# Patient Record
Sex: Female | Born: 1980 | Race: White | Hispanic: Yes | Smoking: Former smoker
Health system: Southern US, Community
[De-identification: ages and names within clinical notes are randomized; demographics above are authoritative.]

## PROBLEM LIST (undated history)

## (undated) ENCOUNTER — Inpatient Hospital Stay (HOSPITAL_COMMUNITY): Payer: Self-pay

## (undated) DIAGNOSIS — O34219 Maternal care for unspecified type scar from previous cesarean delivery: Secondary | ICD-10-CM

## (undated) DIAGNOSIS — IMO0002 Reserved for concepts with insufficient information to code with codable children: Secondary | ICD-10-CM

## (undated) DIAGNOSIS — Z98891 History of uterine scar from previous surgery: Secondary | ICD-10-CM

## (undated) DIAGNOSIS — R87619 Unspecified abnormal cytological findings in specimens from cervix uteri: Secondary | ICD-10-CM

## (undated) HISTORY — DX: Reserved for concepts with insufficient information to code with codable children: IMO0002

## (undated) HISTORY — DX: Unspecified abnormal cytological findings in specimens from cervix uteri: R87.619

---

## 2004-09-29 ENCOUNTER — Emergency Department (HOSPITAL_COMMUNITY): Admission: EM | Admit: 2004-09-29 | Discharge: 2004-09-29 | Payer: Self-pay | Admitting: *Deleted

## 2005-02-17 ENCOUNTER — Ambulatory Visit: Payer: Self-pay | Admitting: Family Medicine

## 2005-02-23 ENCOUNTER — Encounter: Admission: RE | Admit: 2005-02-23 | Discharge: 2005-02-23 | Payer: Self-pay | Admitting: Family Medicine

## 2005-02-25 ENCOUNTER — Ambulatory Visit: Payer: Self-pay | Admitting: *Deleted

## 2005-03-11 ENCOUNTER — Ambulatory Visit (HOSPITAL_BASED_OUTPATIENT_CLINIC_OR_DEPARTMENT_OTHER): Admission: RE | Admit: 2005-03-11 | Discharge: 2005-03-11 | Payer: Self-pay | Admitting: General Surgery

## 2005-03-11 ENCOUNTER — Ambulatory Visit (HOSPITAL_COMMUNITY): Admission: RE | Admit: 2005-03-11 | Discharge: 2005-03-11 | Payer: Self-pay | Admitting: General Surgery

## 2005-03-11 ENCOUNTER — Encounter (INDEPENDENT_AMBULATORY_CARE_PROVIDER_SITE_OTHER): Payer: Self-pay | Admitting: Specialist

## 2005-05-31 ENCOUNTER — Emergency Department (HOSPITAL_COMMUNITY): Admission: EM | Admit: 2005-05-31 | Discharge: 2005-05-31 | Payer: Self-pay | Admitting: Emergency Medicine

## 2007-04-04 ENCOUNTER — Inpatient Hospital Stay (HOSPITAL_COMMUNITY): Admission: AD | Admit: 2007-04-04 | Discharge: 2007-04-04 | Payer: Self-pay | Admitting: Obstetrics and Gynecology

## 2007-04-04 ENCOUNTER — Ambulatory Visit: Payer: Self-pay | Admitting: Obstetrics and Gynecology

## 2007-04-05 ENCOUNTER — Ambulatory Visit (HOSPITAL_COMMUNITY): Admission: RE | Admit: 2007-04-05 | Discharge: 2007-04-05 | Payer: Self-pay | Admitting: Anesthesiology

## 2007-07-05 ENCOUNTER — Inpatient Hospital Stay (HOSPITAL_COMMUNITY): Admission: AD | Admit: 2007-07-05 | Discharge: 2007-07-05 | Payer: Self-pay | Admitting: Obstetrics & Gynecology

## 2007-07-07 ENCOUNTER — Inpatient Hospital Stay (HOSPITAL_COMMUNITY): Admission: AD | Admit: 2007-07-07 | Discharge: 2007-07-09 | Payer: Self-pay | Admitting: Obstetrics & Gynecology

## 2009-01-15 ENCOUNTER — Encounter: Payer: Self-pay | Admitting: Family Medicine

## 2009-01-15 ENCOUNTER — Inpatient Hospital Stay (HOSPITAL_COMMUNITY): Admission: AD | Admit: 2009-01-15 | Discharge: 2009-01-17 | Payer: Self-pay | Admitting: Family Medicine

## 2009-01-15 ENCOUNTER — Ambulatory Visit: Payer: Self-pay | Admitting: Advanced Practice Midwife

## 2009-01-24 ENCOUNTER — Ambulatory Visit: Payer: Self-pay | Admitting: Family Medicine

## 2009-01-24 LAB — CONVERTED CEMR LAB
Antibody Screen: NEGATIVE
Rh Type: POSITIVE

## 2009-02-07 ENCOUNTER — Encounter: Payer: Self-pay | Admitting: Family

## 2009-02-07 ENCOUNTER — Ambulatory Visit: Payer: Self-pay | Admitting: Obstetrics & Gynecology

## 2009-02-07 LAB — CONVERTED CEMR LAB: GC Probe Amp, Genital: NEGATIVE

## 2009-02-08 ENCOUNTER — Encounter: Payer: Self-pay | Admitting: Family

## 2009-02-08 LAB — CONVERTED CEMR LAB

## 2009-02-14 ENCOUNTER — Ambulatory Visit: Payer: Self-pay | Admitting: Advanced Practice Midwife

## 2009-02-14 ENCOUNTER — Inpatient Hospital Stay (HOSPITAL_COMMUNITY): Admission: AD | Admit: 2009-02-14 | Discharge: 2009-02-14 | Payer: Self-pay | Admitting: Obstetrics & Gynecology

## 2009-02-21 ENCOUNTER — Ambulatory Visit: Payer: Self-pay | Admitting: Obstetrics & Gynecology

## 2009-02-21 ENCOUNTER — Encounter: Payer: Self-pay | Admitting: Obstetrics & Gynecology

## 2009-02-21 ENCOUNTER — Inpatient Hospital Stay (HOSPITAL_COMMUNITY): Admission: AD | Admit: 2009-02-21 | Discharge: 2009-02-23 | Payer: Self-pay | Admitting: Obstetrics & Gynecology

## 2010-12-15 LAB — POCT URINALYSIS DIP (DEVICE)
Bilirubin Urine: NEGATIVE
Bilirubin Urine: NEGATIVE
Glucose, UA: NEGATIVE mg/dL
Glucose, UA: NEGATIVE mg/dL
Ketones, ur: NEGATIVE mg/dL
Nitrite: NEGATIVE
Specific Gravity, Urine: 1.02 (ref 1.005–1.030)
Urobilinogen, UA: 0.2 mg/dL (ref 0.0–1.0)
Urobilinogen, UA: 0.2 mg/dL (ref 0.0–1.0)

## 2010-12-15 LAB — MISCELLANEOUS TEST: Miscellaneous Test: 125617

## 2010-12-15 LAB — CBC
Hemoglobin: 12.7 g/dL (ref 12.0–15.0)
MCHC: 34.6 g/dL (ref 30.0–36.0)
MCV: 91.4 fL (ref 78.0–100.0)
MCV: 92 fL (ref 78.0–100.0)
Platelets: 322 10*3/uL (ref 150–400)
RBC: 3.99 MIL/uL (ref 3.87–5.11)
WBC: 10.1 10*3/uL (ref 4.0–10.5)
WBC: 10.7 10*3/uL — ABNORMAL HIGH (ref 4.0–10.5)

## 2010-12-15 LAB — STREP B DNA PROBE: Strep Group B Ag: POSITIVE

## 2010-12-15 LAB — URINALYSIS, ROUTINE W REFLEX MICROSCOPIC
Nitrite: NEGATIVE
Protein, ur: NEGATIVE mg/dL
Urobilinogen, UA: 0.2 mg/dL (ref 0.0–1.0)

## 2010-12-15 LAB — RPR: RPR Ser Ql: NONREACTIVE

## 2010-12-15 LAB — WET PREP, GENITAL: Yeast Wet Prep HPF POC: NONE SEEN

## 2010-12-16 LAB — DIFFERENTIAL
Basophils Relative: 1 % (ref 0–1)
Eosinophils Absolute: 0.2 10*3/uL (ref 0.0–0.7)
Monocytes Relative: 5 % (ref 3–12)
Neutrophils Relative %: 75 % (ref 43–77)

## 2010-12-16 LAB — URINALYSIS, ROUTINE W REFLEX MICROSCOPIC
Glucose, UA: NEGATIVE mg/dL
Ketones, ur: NEGATIVE mg/dL
pH: 5.5 (ref 5.0–8.0)

## 2010-12-16 LAB — CBC
MCHC: 35.4 g/dL (ref 30.0–36.0)
MCV: 92.3 fL (ref 78.0–100.0)
Platelets: 319 10*3/uL (ref 150–400)
RBC: 3.87 MIL/uL (ref 3.87–5.11)

## 2010-12-16 LAB — WET PREP, GENITAL
Clue Cells Wet Prep HPF POC: NONE SEEN
Trich, Wet Prep: NONE SEEN
Yeast Wet Prep HPF POC: NONE SEEN

## 2010-12-16 LAB — POCT URINALYSIS DIP (DEVICE)
Hgb urine dipstick: NEGATIVE
Protein, ur: NEGATIVE mg/dL
Specific Gravity, Urine: <=1.005 (ref 1.005–1.030)
Urobilinogen, UA: 0.2 mg/dL (ref 0.0–1.0)
pH: 5.5 (ref 5.0–8.0)

## 2010-12-16 LAB — RAPID URINE DRUG SCREEN, HOSP PERFORMED
Amphetamines: NOT DETECTED
Opiates: NOT DETECTED
Tetrahydrocannabinol: NOT DETECTED

## 2010-12-16 LAB — GC/CHLAMYDIA PROBE AMP, GENITAL
Chlamydia, DNA Probe: NEGATIVE
GC Probe Amp, Genital: NEGATIVE

## 2010-12-16 LAB — RAPID HIV SCREEN (WH-MAU): Rapid HIV Screen: NONREACTIVE

## 2011-01-20 NOTE — Discharge Summary (Signed)
NAME:  Janice Chase, Janice Chase NO.:  [id number]   MEDICAL RECORD NO.:  [id number]         PATIENT TYPE:  WINP   LOCATION:                                FAC[facility]:  [facility]   PHYSICIAN:  Lazaro Arms, M.D.   DATE OF BIRTH:  1981-02-23   DATE OF ADMISSION:  02/21/2009  DATE OF DISCHARGE:                               DISCHARGE SUMMARY   ADMITTING DIAGNOSES:  1. Intrauterine pregnancy at 33-0/7 weeks.  2. Preterm premature rupture of membranes.  3. Early active labor.   DISCHARGE DIAGNOSES:  1. Intrauterine pregnancy at 33-0/7 weeks.  2. Preterm premature rupture of membranes.  3. Early active labor.   HOSPITAL COURSE:  Janice Chase is a 30 year old gravida 3, para 2-0-0-2,  who presented at 32-6/[redacted] weeks gestation with PPROM and early active  labor.  Her prenatal care has been intermittent at the [facility].  She began at 27 weeks' gestation.  Her pregnancy has been  remarkable for:  1. Septate uterus.  2. Previous cesarean section with 1 VBAC.  3. Late to care.   Upon arrival to maternity admissions, the patient had gross rupture of  membranes and her cervix was 4 cm, 50%.  She was having contractions  every 4 minutes.  She was admitted to labor and delivery.  The patient  received ampicillin and erythromycin.  Group B strep and gonorrhea and  Chlamydia were collected upon admission.  Fetal heart rate tracing was  reactive.  Her vital signs were stable.  She was having some lower  abdominal pain, but there were no other signs of infection and on the  evening of June 17, augmentation was started with low-dose Pitocin.  The  patient was consented for a VBAC.  She progressed to delivery at 8:00  p.m. on June 17.  Infant was a viable female, weight of 4 pounds and 5  ounces, Apgars of 7 and 8.  She was taken to the NICU.  The patient  delivered placenta spontaneously and did not have her repair.  By postop  day #1, the patient was doing well.  Her vital signs  were stable.  Her  group B strep was negative.  Gonorrhea and Chlamydia were negative.  CBC  on postpartum day #1, hemoglobin was 12.9 and had been 12.7 predelivery.  The patient was breast-feeding and was planning on Depo-Provera and then  Implanon at her postpartum visit.  By postpartum day #2, she was deemed  to have received full benefit of her hospital stay and was discharged  home.   DISCHARGE MEDICATIONS:  Motrin 600 mg 1 p.o. q.6 h p.r.n. pain.  Prenatal vitamin 1 p.o. daily.  Discharge followup will occur in 6 weeks  or as needed at the [facility].      Cam Hai, C.N.M.      Lazaro Arms, M.D.  Electronically Signed    KS/MEDQ  D:  02/23/2009  T:  02/23/2009  Job:  [id number]

## 2011-01-20 NOTE — Discharge Summary (Signed)
NAME:  Janice Chase, Janice Chase NO.:  [id number]   MEDICAL RECORD NO.:  [id number]          PATIENT TYPE:  INP   LOCATION:  9156                          [facility]ACILITY:  [facility]   PHYSICIAN:  Tanya S. Shawnie Pons, M.D.   DATE OF BIRTH:  Jul 21, 1981   DATE OF ADMISSION:  01/15/2009  DATE OF DISCHARGE:  01/17/2009                               DISCHARGE SUMMARY   DISCHARGE DIAGNOSES:  1. Preterm labor, threatened.  2. Intrauterine pregnancy at 65 and 6/7 weeks' gestational age.  3. History of previous cesarean section.  4. Constipation.   REASON FOR ADMISSION:  Ms. [name] is a 30 year old gravida 3,  para 2-0-0-2, who presented at 47 and 4/7 weeks' gestational age  determined by an ultrasound on the day of her presentation.  She  presented due to mild-to-moderate lower abdominal pain that was  intermittent and episodic.  She denied any leakage of fluids, but noted  some spotting previous to her admission.  On evaluation in the MAU, she  was found have contractions on the monitoring, and on examination of her  cervix, she was found to be 1 to 1.5-cm dilated.  Because of concern for  preterm labor, the patient was admitted for treatment and further  observation.   HOSPITAL COURSE:  The patient was admitted to antenatal, given magnesium  for contractions.  She was also given a course of betamethasone which  she received 2 shots 24 hours apart.  She did have moderate abdominal  pain and low back pain that required narcotics to control.  On further  questioning the patient, it turns out that she has not had regular bowel  movements and has not had a bowel movement in over 4 days.  The patient  was given some stool softeners to treat this, and she states after she  had a bowel she did feel somewhat better.  During her stay, her  contractions ceased.  She had no change in her cervical exam.  She had  her magnesium turned off in the morning of the day of discharge, and  after 4 hours,  she demonstrated no evidence of contractions other than  some mild uterine irritability.  Again, her cervix remained unchanged,  so she was discharged home in good condition with recommendations to  stay on bed rest.  Additionally, she was given a prescription for  Procardia.   MEDICATIONS AT DISCHARGE:  1. Prenatal vitamins.  2. Procardia XL 30 mg 1 tablet twice daily.  3. Colace p.r.n.  4. Nasal saline p.r.n.   Instruction for the patient, the patient was instructed on use of all  her medications.  Additionally, the patient was instructed to maintain  bedrest with permission to get up and eat and to use the bathroom as  well, and perhaps for some other brief episodes as long as she stayed  local to the house.  She was also given an appointment to follow up  in [facility] on Jan 22, 2009, at 9 a.m.  The patient voiced  understanding of instructions and her questions were answered.   CONDITION  ON DISCHARGE:  Good.      Odie Sera, DO  Electronically Signed     ______________________________  Shelbie Proctor. Shawnie Pons, M.D.    MC/MEDQ  D:  01/17/2009  T:  01/18/2009  Job:  [id number]

## 2011-01-23 NOTE — Op Note (Signed)
NAME:  KENZLEIGH, SEDAM NO.:  [id number]   MEDICAL RECORD NO.:  [id number]          PATIENT TYPE:  AMB   LOCATION:  DSC                          FACILITY:  [facility]   PHYSICIAN:  Rose Phi. Maple Hudson, M.D.   DATE OF BIRTH:  10-Feb-1981   DATE OF PROCEDURE:  03/11/2005  DATE OF DISCHARGE:                                 OPERATIVE REPORT   PREOPERATIVE DIAGNOSIS:  Fibroadenoma of the left breast.   POSTOPERATIVE DIAGNOSIS:  Fibroadenoma of the left breast.   OPERATION:  Excision of left breast mass.   SURGEON:  Rose Phi. Maple Hudson, M.D.   ANESTHESIA:  General anesthesia.   DESCRIPTION OF PROCEDURE:  Patient placed on the operating table with the  arms extended on the arm board and the left breast prepped and draped in the  usual fashion.   The palpable nodule was at the 3 o'clock position of the left breast and  after pinning that between my fingers, a small radial incision was outlined  and then the incision made and the fibroadenoma exposed.  I grasped it and  excised it.  There was no bleeding.  Subcuticular closure of 4-0 Monocryl  and Steri-Strips were carried out.   A dressing was then applied and the patient was transferred to the recovery  room in satisfactory condition, having tolerated the procedure well.       PRY/MEDQ  D:  03/11/2005  T:  03/11/2005  Job:  [id number]

## 2011-06-17 LAB — CBC
HCT: 38.3
Hemoglobin: 12.1
Hemoglobin: 13.3
MCHC: 34.6
MCV: 93.6
RBC: 3.74 — ABNORMAL LOW
RBC: 4.15
WBC: 10.7 — ABNORMAL HIGH
WBC: 10.8 — ABNORMAL HIGH

## 2011-06-22 LAB — URINALYSIS, ROUTINE W REFLEX MICROSCOPIC
Glucose, UA: NEGATIVE
Hgb urine dipstick: NEGATIVE
Ketones, ur: NEGATIVE
Protein, ur: NEGATIVE
pH: 6

## 2011-06-22 LAB — WET PREP, GENITAL
Trich, Wet Prep: NONE SEEN
Yeast Wet Prep HPF POC: NONE SEEN

## 2011-06-22 LAB — BASIC METABOLIC PANEL
CO2: 24
Calcium: 8.7
Creatinine, Ser: 0.49
GFR calc Af Amer: >60
GFR calc non Af Amer: >60
Glucose, Bld: 74
Sodium: 135

## 2011-06-22 LAB — CBC
Hemoglobin: 12.6
MCHC: 34.5
RDW: 13.9

## 2011-06-22 LAB — GC/CHLAMYDIA PROBE AMP, GENITAL: GC Probe Amp, Genital: NEGATIVE

## 2012-04-28 ENCOUNTER — Inpatient Hospital Stay (HOSPITAL_COMMUNITY): Payer: Self-pay

## 2012-04-28 ENCOUNTER — Inpatient Hospital Stay (HOSPITAL_COMMUNITY)
Admission: AD | Admit: 2012-04-28 | Discharge: 2012-04-28 | Disposition: A | Discharge status: Home / Self Care | Payer: Self-pay | Source: Ambulatory Visit | Attending: Obstetrics & Gynecology | Admitting: Obstetrics & Gynecology

## 2012-04-28 ENCOUNTER — Encounter (HOSPITAL_COMMUNITY): Payer: Self-pay | Admitting: *Deleted

## 2012-04-28 DIAGNOSIS — O2390 Unspecified genitourinary tract infection in pregnancy, unspecified trimester: Secondary | ICD-10-CM | POA: Insufficient documentation

## 2012-04-28 DIAGNOSIS — B373 Candidiasis of vulva and vagina: Secondary | ICD-10-CM | POA: Insufficient documentation

## 2012-04-28 DIAGNOSIS — R109 Unspecified abdominal pain: Secondary | ICD-10-CM | POA: Insufficient documentation

## 2012-04-28 DIAGNOSIS — Z3490 Encounter for supervision of normal pregnancy, unspecified, unspecified trimester: Secondary | ICD-10-CM

## 2012-04-28 DIAGNOSIS — B3731 Acute candidiasis of vulva and vagina: Secondary | ICD-10-CM | POA: Diagnosis present

## 2012-04-28 LAB — URINALYSIS, ROUTINE W REFLEX MICROSCOPIC
Bilirubin Urine: NEGATIVE
Glucose, UA: NEGATIVE mg/dL
Ketones, ur: 15 mg/dL — AB
pH: 6 (ref 5.0–8.0)

## 2012-04-28 LAB — CBC
Hemoglobin: 12.6 g/dL (ref 12.0–15.0)
MCH: 30.4 pg (ref 26.0–34.0)
MCV: 92.3 fL (ref 78.0–100.0)
RBC: 4.15 MIL/uL (ref 3.87–5.11)

## 2012-04-28 LAB — URINE MICROSCOPIC-ADD ON

## 2012-04-28 MED ORDER — FLUCONAZOLE 150 MG PO TABS
150.0000 mg | ORAL_TABLET | ORAL | Status: AC
  Administered 2012-04-28: 150 mg via ORAL
  Filled 2012-04-28: qty 1

## 2012-04-28 NOTE — MAU Note (Signed)
Pain started a week ago. Left lower abd to side, comes and goes.- like a stitch in her side.  Thinks she is more than 2 months, "is too big".

## 2012-04-28 NOTE — MAU Note (Signed)
lmp was June 11, was very light.  Pos preg test in July.

## 2012-04-28 NOTE — MAU Note (Signed)
Pt described left abd pain that starts on the side and radiates to lower abd x 1 week.  No vaginal bleeding or abnormal discharge.

## 2012-04-28 NOTE — MAU Provider Note (Signed)
Chief Complaint: Abdominal Pain   First Provider Initiated Contact with Patient 04/28/12 1457     SUBJECTIVE HPI: Janice Chase is a 31 y.o. [id number] at [redacted]w[redacted]d by LMP who presents to maternity admissions reporting left side pain radiating to left inguinal area x1 week.  She also has vaginal itching/burning with some white discharge.  She denies LOF, vaginal bleeding, urinary symptoms, h/a, dizziness, n/v, or fever/chills.     Past Medical History  Diagnosis Date  . No pertinent past medical history    Past Surgical History  Procedure Date  . Cesarean section    History   Social History  . Marital Status: Single    Spouse Name: N/A    Number of Children: N/A  . Years of Education: N/A   Occupational History  . Not on file.   Social History Main Topics  . Smoking status: Former Games developer  . Smokeless tobacco: Former Neurosurgeon    Quit date: 04/28/2005  . Alcohol Use: No     Used cocaine 7 years ago  . Drug Use: No  . Sexually Active: Yes    Birth Control/ Protection: None   Other Topics Concern  . Not on file   Social History Narrative  . No narrative on file   No current facility-administered medications on file prior to encounter.   No current outpatient prescriptions on file prior to encounter.   No Known Allergies  ROS: Pertinent items in HPI  OBJECTIVE Blood pressure 98/52, pulse 78, temperature 98.4 F (36.9 C), temperature source Oral, resp. rate 20, height 4' 9.75" (1.467 m), weight 84.823 kg (187 lb), last menstrual period 02/16/2012. GENERAL: Well-developed, well-nourished female in no acute distress.  HEENT: Normocephalic HEART: normal rate RESP: normal effort ABDOMEN: Soft, non-tender EXTREMITIES: Nontender, no edema NEURO: Alert and oriented Pelvic exam: Cervix pink, visually closed, without lesion, moderate amount white creamy discharge, vaginal walls with mild erythema and external genitalia normal Bimanual exam: Cervix 0/long/high, firm, posterior,  neg CMT, uterus nontender, nonenlarged, adnexa without tenderness, enlargement, or mass  LAB RESULTS Results for orders placed during the hospital encounter of 04/28/12 (from the past 24 hour(s))  URINALYSIS, ROUTINE W REFLEX MICROSCOPIC     Status: Abnormal   Collection Time   04/28/12  2:05 PM      Component Value Range   Color, Urine AMBER (*) YELLOW   APPearance CLEAR  CLEAR   Specific Gravity, Urine >1.030 (*) 1.005 - 1.030   pH 6.0  5.0 - 8.0   Glucose, UA NEGATIVE  NEGATIVE mg/dL   Hgb urine dipstick NEGATIVE  NEGATIVE   Bilirubin Urine NEGATIVE  NEGATIVE   Ketones, ur 15 (*) NEGATIVE mg/dL   Protein, ur NEGATIVE  NEGATIVE mg/dL   Urobilinogen, UA 0.2  0.0 - 1.0 mg/dL   Nitrite NEGATIVE  NEGATIVE   Leukocytes, UA TRACE (*) NEGATIVE  URINE MICROSCOPIC-ADD ON     Status: Abnormal   Collection Time   04/28/12  2:05 PM      Component Value Range   Squamous Epithelial / LPF FEW (*) RARE   WBC, UA 3-6  <3 WBC/hpf   RBC / HPF 3-6  <3 RBC/hpf   Bacteria, UA MANY (*) RARE   Urine-Other MUCOUS PRESENT    POCT PREGNANCY, URINE     Status: Abnormal   Collection Time   04/28/12  2:23 PM      Component Value Range   Preg Test, Ur POSITIVE (*) NEGATIVE  WET PREP, GENITAL  Status: Abnormal   Collection Time   04/28/12  3:00 PM      Component Value Range   Yeast Wet Prep HPF POC MODERATE (*) NONE SEEN   Trich, Wet Prep NONE SEEN  NONE SEEN   Clue Cells Wet Prep HPF POC NONE SEEN  NONE SEEN   WBC, Wet Prep HPF POC FEW (*) NONE SEEN  CBC     Status: Normal   Collection Time   04/28/12  3:16 PM      Component Value Range   WBC 8.5  4.0 - 10.5 K/uL   RBC 4.15  3.87 - 5.11 MIL/uL   Hemoglobin 12.6  12.0 - 15.0 g/dL   HCT 45.4  09.8 - 11.9 %   MCV 92.3  78.0 - 100.0 fL   MCH 30.4  26.0 - 34.0 pg   MCHC 32.9  30.0 - 36.0 g/dL   RDW 14.7  82.9 - 56.2 %   Platelets 299  150 - 400 K/uL    IMAGING   ASSESSMENT 1. Normal IUP (intrauterine pregnancy) on prenatal ultrasound   2.  Candidal vaginitis     PLAN Discharge home Diflucan 150 mg PO x1 dose in MAU F/U with early prenatal care, pt plans to go to [facility] Return to MAU as needed   Sharen Counter Certified Nurse-Midwife 04/28/2012  3:43 PM

## 2012-04-29 LAB — GC/CHLAMYDIA PROBE AMP, GENITAL: GC Probe Amp, Genital: NEGATIVE

## 2012-05-20 ENCOUNTER — Encounter (HOSPITAL_COMMUNITY): Payer: Self-pay

## 2012-05-20 ENCOUNTER — Inpatient Hospital Stay (HOSPITAL_COMMUNITY)
Admission: AD | Admit: 2012-05-20 | Discharge: 2012-05-20 | Disposition: A | Discharge status: Home / Self Care | Payer: Self-pay | Source: Ambulatory Visit | Attending: Obstetrics & Gynecology | Admitting: Obstetrics & Gynecology

## 2012-05-20 DIAGNOSIS — R109 Unspecified abdominal pain: Secondary | ICD-10-CM | POA: Insufficient documentation

## 2012-05-20 DIAGNOSIS — O2390 Unspecified genitourinary tract infection in pregnancy, unspecified trimester: Secondary | ICD-10-CM | POA: Insufficient documentation

## 2012-05-20 DIAGNOSIS — B373 Candidiasis of vulva and vagina: Secondary | ICD-10-CM

## 2012-05-20 DIAGNOSIS — B3731 Acute candidiasis of vulva and vagina: Secondary | ICD-10-CM | POA: Insufficient documentation

## 2012-05-20 LAB — URINALYSIS, ROUTINE W REFLEX MICROSCOPIC
Bilirubin Urine: NEGATIVE
Hgb urine dipstick: NEGATIVE
Nitrite: NEGATIVE
Specific Gravity, Urine: 1.02 (ref 1.005–1.030)
pH: 6.5 (ref 5.0–8.0)

## 2012-05-20 LAB — WET PREP, GENITAL: Yeast Wet Prep HPF POC: NONE SEEN

## 2012-05-20 MED ORDER — FLUCONAZOLE 150 MG PO TABS: 150.0000 mg | ORAL_TABLET | Freq: Once | ORAL | Status: AC

## 2012-05-20 MED ORDER — FLUCONAZOLE 150 MG PO TABS
150.0000 mg | ORAL_TABLET | Freq: Once | ORAL | Status: AC
  Administered 2012-05-20: 150 mg via ORAL
  Filled 2012-05-20: qty 1

## 2012-05-20 NOTE — MAU Provider Note (Signed)
Chief Complaint:  Abdominal Pain   First Provider Initiated Contact with Patient 05/20/12 1904      HPI: Janice Chase is a 31 y.o. [id number] at [redacted]w[redacted]d by LMP who presents to maternity admissions reporting left sided abdominal pain and suprapubic discomfort. She states this is the same pain that she had when she was seen here 8/13 although it has migrated down to being more suprapubic. She also endorses significant vaginal discharge, pain and itching that is new from her last visit.  She denies LOF, vaginal bleeding, urinary symptoms, h/a, or fever/chills.   She is having some nausea and vomiting and lightheadedness with standing.  . Denies contractions, leakage of fluid or vaginal bleeding.    Pregnancy Course:   Past Medical History: Past Medical History  Diagnosis Date  . No pertinent past medical history     Past obstetric history: OB History    Grav Para Term Preterm Abortions TAB SAB Ect Mult Living   4 3 2 1      3      # Outc Date GA Lbr Len/2nd Wgt Sex Del Anes PTL Lv   1 TRM            2 TRM            3 PRE            4 CUR               Past Surgical History: Past Surgical History  Procedure Date  . Cesarean section     Family History: Family History  Problem Relation Age of Onset  . Other Neg Hx     Social History: History  Substance Use Topics  . Smoking status: Former Games developer  . Smokeless tobacco: Former Neurosurgeon    Quit date: 04/28/2005  . Alcohol Use: No     Used cocaine 7 years ago    Allergies: No Known Allergies  Meds:  No prescriptions prior to admission    ROS: Pertinent findings in history of present illness.  Physical Exam  Blood pressure 82/45, pulse 90, temperature 99 F (37.2 C), resp. rate 18, last menstrual period 02/16/2012. GENERAL: Well-developed, well-nourished female in no acute distress.  HEENT: normocephalic HEART: normal rate RESP: normal effort ABDOMEN: Soft, mild tenderness throughout without rebound or guarding.    EXTREMITIES: Nontender, no edema NEURO: alert and oriented SPECULUM EXAM: NEFG, significant white curdy discharge, no blood, cervix with ectropion. Vaginal irritation and labial Erythema.  SVE: no CMT, no bladder tenderness. Some vaginal wall tenderness. Cervix externally 1, internally 0/thick/high.    Dilation: Closed Effacement (%): Thick Cervical Position: Posterior Station: -3  FHT:  145 on doppler    Labs: Results for orders placed during the hospital encounter of 05/20/12 (from the past 24 hour(s))  URINALYSIS, ROUTINE W REFLEX MICROSCOPIC     Status: Normal   Collection Time   05/20/12  6:07 PM      Component Value Range   Color, Urine YELLOW  YELLOW   APPearance CLEAR  CLEAR   Specific Gravity, Urine 1.020  1.005 - 1.030   pH 6.5  5.0 - 8.0   Glucose, UA NEGATIVE  NEGATIVE mg/dL   Hgb urine dipstick NEGATIVE  NEGATIVE   Bilirubin Urine NEGATIVE  NEGATIVE   Ketones, ur NEGATIVE  NEGATIVE mg/dL   Protein, ur NEGATIVE  NEGATIVE mg/dL   Urobilinogen, UA 0.2  0.0 - 1.0 mg/dL   Nitrite NEGATIVE  NEGATIVE   Leukocytes,  UA NEGATIVE  NEGATIVE  WET PREP, GENITAL     Status: Abnormal   Collection Time   05/20/12  7:20 PM      Component Value Range   Yeast Wet Prep HPF POC NONE SEEN  NONE SEEN   Trich, Wet Prep NONE SEEN  NONE SEEN   Clue Cells Wet Prep HPF POC NONE SEEN  NONE SEEN   WBC, Wet Prep HPF POC MANY (*) NONE SEEN    Imaging:  US Ob Comp Less 14 Wks  04/28/2012  OBSTETRICAL ULTRASOUND: This exam was performed within a [facility]. The OB US report was generated in the AS system, and faxed to the ordering physician.   This report is also available in [employer] and in the [employer]. See AS Obstetric US report.   ED Course   Assessment: 1. Candidal vaginitis     Plan:  1) vaginal discharge/abd pain - exam consistent with significant yeast infection- both internal and external. - wet prep negative for yeast but  will treat empirically based on exam  - GC/chl sent - abdominal pain likely 2/2 this   Plan to follow up with the health department for prenantal care. We discussed she will need 17-OHP given hx of PT delivery.    Discharge home      Follow-up Information    Schedule an appointment as soon as possible for a visit with [facility] [place].   Contact information:   [address] [place] [address] [phone]          Medication List     As of 05/20/2012  9:09 PM    TAKE these medications         fluconazole 150 MG tablet   Commonly known as: DIFLUCAN   Take 1 tablet (150 mg total) by mouth once.         Rulon Abide Medical Resident 05/20/2012 9:09 PM  I have seen and examined this patient and I agree with the plan of care. Arabella Merles CNM 05/21/2012 12:31am

## 2012-05-20 NOTE — MAU Note (Signed)
Onset of lower abdominal pain for 2 days into vagina having a mucus discharge and lower back pain for 3 weeks, denies vaginal bleeding, no dysuria, regular bowel movements.

## 2012-05-21 LAB — GC/CHLAMYDIA PROBE AMP, GENITAL
Chlamydia, DNA Probe: NEGATIVE
GC Probe Amp, Genital: NEGATIVE

## 2012-06-27 ENCOUNTER — Other Ambulatory Visit (HOSPITAL_COMMUNITY): Payer: Self-pay | Admitting: Nurse Practitioner

## 2012-06-27 DIAGNOSIS — Z3689 Encounter for other specified antenatal screening: Secondary | ICD-10-CM

## 2012-06-27 LAB — OB RESULTS CONSOLE RPR: RPR: NONREACTIVE

## 2012-06-27 LAB — CYSTIC FIBROSIS DIAGNOSTIC STUDY: Interpretation-CFDNA:: NEGATIVE

## 2012-06-27 LAB — GLUCOSE TOLERANCE, 1 HOUR (50G) W/O FASTING: Glucose, 1 Hour GTT: 80

## 2012-06-27 LAB — OB RESULTS CONSOLE ABO/RH: RH Type: POSITIVE

## 2012-06-27 LAB — OB RESULTS CONSOLE GC/CHLAMYDIA: Gonorrhea: NEGATIVE

## 2012-06-27 LAB — OB RESULTS CONSOLE HEPATITIS B SURFACE ANTIGEN: Hepatitis B Surface Ag: NEGATIVE

## 2012-06-27 LAB — OB RESULTS CONSOLE ANTIBODY SCREEN: Antibody Screen: NEGATIVE

## 2012-06-29 ENCOUNTER — Ambulatory Visit (HOSPITAL_COMMUNITY)
Admission: RE | Admit: 2012-06-29 | Discharge: 2012-06-29 | Disposition: A | Discharge status: Home / Self Care | Payer: Medicaid Other | Source: Ambulatory Visit | Attending: Nurse Practitioner | Admitting: Nurse Practitioner

## 2012-06-29 DIAGNOSIS — O34219 Maternal care for unspecified type scar from previous cesarean delivery: Secondary | ICD-10-CM | POA: Insufficient documentation

## 2012-06-29 DIAGNOSIS — Z1389 Encounter for screening for other disorder: Secondary | ICD-10-CM | POA: Insufficient documentation

## 2012-06-29 DIAGNOSIS — O358XX0 Maternal care for other (suspected) fetal abnormality and damage, not applicable or unspecified: Secondary | ICD-10-CM | POA: Insufficient documentation

## 2012-06-29 DIAGNOSIS — Z3689 Encounter for other specified antenatal screening: Secondary | ICD-10-CM

## 2012-06-29 DIAGNOSIS — Z363 Encounter for antenatal screening for malformations: Secondary | ICD-10-CM | POA: Insufficient documentation

## 2012-07-14 ENCOUNTER — Encounter: Payer: Self-pay | Admitting: Advanced Practice Midwife

## 2012-07-14 ENCOUNTER — Ambulatory Visit (INDEPENDENT_AMBULATORY_CARE_PROVIDER_SITE_OTHER): Payer: Medicaid Other | Admitting: Advanced Practice Midwife

## 2012-07-14 VITALS — BP 93/64 | Temp 97.5°F | Ht <= 58 in | Wt 193.3 lb

## 2012-07-14 DIAGNOSIS — M5432 Sciatica, left side: Secondary | ICD-10-CM

## 2012-07-14 DIAGNOSIS — O09899 Supervision of other high risk pregnancies, unspecified trimester: Secondary | ICD-10-CM

## 2012-07-14 DIAGNOSIS — N898 Other specified noninflammatory disorders of vagina: Secondary | ICD-10-CM

## 2012-07-14 DIAGNOSIS — O9989 Other specified diseases and conditions complicating pregnancy, childbirth and the puerperium: Secondary | ICD-10-CM

## 2012-07-14 DIAGNOSIS — M5430 Sciatica, unspecified side: Secondary | ICD-10-CM

## 2012-07-14 DIAGNOSIS — O3400 Maternal care for unspecified congenital malformation of uterus, unspecified trimester: Secondary | ICD-10-CM

## 2012-07-14 DIAGNOSIS — O09219 Supervision of pregnancy with history of pre-term labor, unspecified trimester: Secondary | ICD-10-CM

## 2012-07-14 DIAGNOSIS — O34219 Maternal care for unspecified type scar from previous cesarean delivery: Secondary | ICD-10-CM

## 2012-07-14 LAB — POCT URINALYSIS DIP (DEVICE)
Bilirubin Urine: NEGATIVE
Glucose, UA: NEGATIVE mg/dL
Nitrite: NEGATIVE
Urobilinogen, UA: 0.2 mg/dL (ref 0.0–1.0)

## 2012-07-14 MED ORDER — HYDROXYPROGESTERONE CAPROATE 250 MG/ML IM OIL: 250.0000 mg | TOPICAL_OIL | INTRAMUSCULAR | Status: DC

## 2012-07-14 NOTE — Progress Notes (Signed)
Pulse 77 Has pain in left buttock that runs down her leg.

## 2012-07-14 NOTE — Progress Notes (Signed)
Nutrition note: 1st visit consult Pt has gained 3.3# @ 21w 2d, which is slightly < expected. Pt reports eating 2 meals & 1-2 snacks/d. Pt reports drinking water, milk, juice & coke (2-3 cans/d). Pt reports no N&V & no heartburn. Pt is taking PNV. Pt given verbal & written education on general nutrition during pregnancy in Spanish. Disc importance of BF. Disc wt gain goals of 11-20# or 0.5#/wk. Encouraged pt to decrease coke to <1-2/d. Pt agrees to continue PNV & decrease amount of coke. Pt receives [facility]. Pt plans to BF. F/u if referred Blondell Reveal, MS, RD, LDN

## 2012-07-15 LAB — WET PREP, GENITAL
Trich, Wet Prep: NONE SEEN
Yeast Wet Prep HPF POC: NONE SEEN

## 2012-07-16 DIAGNOSIS — O3400 Maternal care for unspecified congenital malformation of uterus, unspecified trimester: Secondary | ICD-10-CM | POA: Insufficient documentation

## 2012-07-16 DIAGNOSIS — O34219 Maternal care for unspecified type scar from previous cesarean delivery: Secondary | ICD-10-CM | POA: Insufficient documentation

## 2012-07-16 DIAGNOSIS — O09899 Supervision of other high risk pregnancies, unspecified trimester: Secondary | ICD-10-CM | POA: Insufficient documentation

## 2012-07-16 NOTE — Progress Notes (Signed)
  Subjective:    Janice Chase is being seen today for her first obstetrical visit. She was a transfer from [facility] department due to history of preterm labor and delivery x3. This is not a planned pregnancy. She is at [redacted]w[redacted]d gestation. Her obstetrical history is significant for obesity and History of preterm labor and delivery x3, septate uterus, history of low transverse cesarean for breech with 2 successful VBACs.. Relationship with FOB: significant other, living together. Patient does intend to breast feed. Pregnancy history fully reviewed.  Patient reports heartburn, vaginal irritation and discharge, pain radiating down left leg. No bleeding, no contractions, no cramping, no leaking,  Review of Systems:   Review of Systems otherwise negative  Objective:     BP 93/64  Temp 97.5 F (36.4 C)  Ht 4' 7.75" (1.416 m)  Wt 87.68 kg (193 lb 4.8 oz)  BMI 43.73 kg/m2  LMP 02/16/2012 Physical Exam  Exam General appearance - alert, well appearing, and in no distress, oriented to person, place, and time and overweight. Normal gait. Chest - clear to auscultation, no wheezes, rales or rhonchi, symmetric air entry Heart - normal rate, regular rhythm, normal S1, S2, no murmurs, rubs, clicks or gallops Abdomen - soft, nontender, nondistended, no masses or organomegaly no CVA tenderness, gravid, size equals dates. Pelvic - normal external genitalia, vulva, vagina, cervix, uterus and adnexa, moderate amount of thick, creamy, odorless discharge. Cervix 0/0/-3, firm, posterior. No vaginal bleeding. Extremities - no pedal edema noted. Equal strength bilaterally.   Assessment:    Pregnancy: [id number] 1. History of preterm delivery, currently pregnant  US OB Follow Up, hydroxyprogesterone caproate (DELALUTIN) 250 mg/mL injection 250 mg  2. Vaginal discharge in pregnancy  Cervicovaginal ancillary only, Wet prep, genital  3. Septate uterus complicating pregnancy  US OB Follow Up  4.  Supervision of other high-risk pregnancy    5. History of cesarean delivery, currently pregnant    6. Sciatica of left side      Plan:     Initial labs drawn at health department, reviewed. Prenatal vitamins. Problem list reviewed and updated. AFP3 discussed: results reviewed. Role of ultrasound in pregnancy discussed; fetal survey: results reviewed. Incomplete views of the heart and spine. Will schedule followup in 2 weeks. This will also give [place] an opportunity to look at cervical length again. Amniocentesis discussed: not indicated. Follow up in 2 weeks. 30% of 40 min visit spent on counseling and coordination of care.  17-P. started today. Will give weekly until 36 weeks. Plans VBAC. Preterm labor precautions and comfort measures for sciatica  Dorathy Kinsman 07/14/2012

## 2012-07-21 ENCOUNTER — Other Ambulatory Visit (HOSPITAL_COMMUNITY)
Admission: RE | Admit: 2012-07-21 | Discharge: 2012-07-21 | Disposition: A | Discharge status: Home / Self Care | Payer: Medicaid Other | Source: Ambulatory Visit | Attending: Obstetrics and Gynecology | Admitting: Obstetrics and Gynecology

## 2012-07-21 ENCOUNTER — Ambulatory Visit (INDEPENDENT_AMBULATORY_CARE_PROVIDER_SITE_OTHER): Payer: Medicaid Other | Admitting: Obstetrics and Gynecology

## 2012-07-21 ENCOUNTER — Encounter: Payer: Self-pay | Admitting: Obstetrics and Gynecology

## 2012-07-21 VITALS — BP 97/65 | Temp 97.5°F | Wt 193.9 lb

## 2012-07-21 DIAGNOSIS — N760 Acute vaginitis: Secondary | ICD-10-CM | POA: Insufficient documentation

## 2012-07-21 DIAGNOSIS — O09899 Supervision of other high risk pregnancies, unspecified trimester: Secondary | ICD-10-CM

## 2012-07-21 DIAGNOSIS — Z113 Encounter for screening for infections with a predominantly sexual mode of transmission: Secondary | ICD-10-CM | POA: Insufficient documentation

## 2012-07-21 MED ORDER — HYDROXYPROGESTERONE CAPROATE 250 MG/ML IM OIL: 250.0000 mg | TOPICAL_OIL | Freq: Once | INTRAMUSCULAR | Status: DC

## 2012-07-21 NOTE — Progress Notes (Signed)
Patient here to recollect lab

## 2012-07-21 NOTE — Addendum Note (Signed)
Addended by: Sherre Lain A on: 07/21/2012 11:02 AM   Modules accepted: Orders

## 2012-07-28 ENCOUNTER — Ambulatory Visit (INDEPENDENT_AMBULATORY_CARE_PROVIDER_SITE_OTHER): Payer: Medicaid Other | Admitting: Obstetrics & Gynecology

## 2012-07-28 VITALS — BP 108/59 | Temp 97.0°F | Wt 197.9 lb

## 2012-07-28 DIAGNOSIS — O34219 Maternal care for unspecified type scar from previous cesarean delivery: Secondary | ICD-10-CM

## 2012-07-28 DIAGNOSIS — O09219 Supervision of pregnancy with history of pre-term labor, unspecified trimester: Secondary | ICD-10-CM

## 2012-07-28 LAB — POCT URINALYSIS DIP (DEVICE)
Hgb urine dipstick: NEGATIVE
Nitrite: NEGATIVE
Protein, ur: NEGATIVE mg/dL
Urobilinogen, UA: 0.2 mg/dL (ref 0.0–1.0)
pH: 6 (ref 5.0–8.0)

## 2012-07-28 MED ORDER — HYDROXYPROGESTERONE CAPROATE 250 MG/ML IM OIL
250.0000 mg | TOPICAL_OIL | Freq: Once | INTRAMUSCULAR | Status: AC
  Administered 2012-07-28: 250 mg via INTRAMUSCULAR

## 2012-07-28 NOTE — Progress Notes (Signed)
Pulse- 82  Edema-feet 

## 2012-08-03 ENCOUNTER — Ambulatory Visit (INDEPENDENT_AMBULATORY_CARE_PROVIDER_SITE_OTHER): Payer: Medicaid Other | Admitting: Obstetrics and Gynecology

## 2012-08-03 VITALS — BP 102/53 | HR 92 | Wt 196.0 lb

## 2012-08-03 DIAGNOSIS — O09219 Supervision of pregnancy with history of pre-term labor, unspecified trimester: Secondary | ICD-10-CM

## 2012-08-03 MED ORDER — HYDROXYPROGESTERONE CAPROATE 250 MG/ML IM OIL
250.0000 mg | TOPICAL_OIL | Freq: Once | INTRAMUSCULAR | Status: AC
  Administered 2012-08-03: 250 mg via INTRAMUSCULAR

## 2012-08-05 ENCOUNTER — Ambulatory Visit (HOSPITAL_COMMUNITY)
Admission: RE | Admit: 2012-08-05 | Discharge: 2012-08-05 | Disposition: A | Discharge status: Home / Self Care | Payer: Medicaid Other | Source: Ambulatory Visit | Attending: Advanced Practice Midwife | Admitting: Advanced Practice Midwife

## 2012-08-05 ENCOUNTER — Other Ambulatory Visit: Payer: Self-pay | Admitting: Advanced Practice Midwife

## 2012-08-05 DIAGNOSIS — O34219 Maternal care for unspecified type scar from previous cesarean delivery: Secondary | ICD-10-CM | POA: Insufficient documentation

## 2012-08-05 DIAGNOSIS — E669 Obesity, unspecified: Secondary | ICD-10-CM | POA: Insufficient documentation

## 2012-08-05 DIAGNOSIS — Z8751 Personal history of pre-term labor: Secondary | ICD-10-CM | POA: Insufficient documentation

## 2012-08-05 DIAGNOSIS — Z3689 Encounter for other specified antenatal screening: Secondary | ICD-10-CM | POA: Insufficient documentation

## 2012-08-05 DIAGNOSIS — O3400 Maternal care for unspecified congenital malformation of uterus, unspecified trimester: Secondary | ICD-10-CM | POA: Insufficient documentation

## 2012-08-05 DIAGNOSIS — O99210 Obesity complicating pregnancy, unspecified trimester: Secondary | ICD-10-CM | POA: Insufficient documentation

## 2012-08-05 NOTE — Progress Notes (Signed)
[name]  had an ultrasound appointment today.  Please see AS-OB/GYN report for details.  Comments There is an active singleton fetus with no apparent dysmorphic features on today's routine anatomic re-examination.  The biometry suggests a fetus with an EFW at the approximately 55th percentile for gestational age.  Technically difficult examination due to maternal insonating characteristics  Impression Active singleton fetus. Normal interval growth. Normal amniotic fluid volume Today's images of the fetal heart and spine effectively complete the anatomic survey, which is without apparent structural defect. H/o preterm delivery and septate uterus Endovaginal imaging demonstrates a long and closed cervix, measuring over 4 cm.   Recommendations 1. 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 2. Weekly 17P injections until 36 weeks 3.  Follow as clinically indicated.  Janice Boga, MD, MS, FACOG Assistant Professor Section of Maternal-Fetal Medicine [facility]

## 2012-08-06 ENCOUNTER — Encounter: Payer: Self-pay | Admitting: Advanced Practice Midwife

## 2012-08-10 ENCOUNTER — Ambulatory Visit (INDEPENDENT_AMBULATORY_CARE_PROVIDER_SITE_OTHER): Payer: Medicaid Other | Admitting: General Practice

## 2012-08-10 VITALS — BP 102/61 | HR 82 | Temp 97.1°F | Ht <= 58 in | Wt 196.1 lb

## 2012-08-10 DIAGNOSIS — O09219 Supervision of pregnancy with history of pre-term labor, unspecified trimester: Secondary | ICD-10-CM

## 2012-08-10 MED ORDER — HYDROXYPROGESTERONE CAPROATE 250 MG/ML IM OIL
250.0000 mg | TOPICAL_OIL | Freq: Once | INTRAMUSCULAR | Status: AC
  Administered 2012-08-10: 250 mg via INTRAMUSCULAR

## 2012-08-17 ENCOUNTER — Ambulatory Visit (INDEPENDENT_AMBULATORY_CARE_PROVIDER_SITE_OTHER): Payer: Medicaid Other | Admitting: Advanced Practice Midwife

## 2012-08-17 ENCOUNTER — Encounter: Payer: Self-pay | Admitting: Advanced Practice Midwife

## 2012-08-17 VITALS — BP 98/59 | Temp 96.5°F | Wt 198.0 lb

## 2012-08-17 DIAGNOSIS — L089 Local infection of the skin and subcutaneous tissue, unspecified: Secondary | ICD-10-CM

## 2012-08-17 DIAGNOSIS — Z3490 Encounter for supervision of normal pregnancy, unspecified, unspecified trimester: Secondary | ICD-10-CM

## 2012-08-17 DIAGNOSIS — Z23 Encounter for immunization: Secondary | ICD-10-CM

## 2012-08-17 DIAGNOSIS — O09219 Supervision of pregnancy with history of pre-term labor, unspecified trimester: Secondary | ICD-10-CM

## 2012-08-17 LAB — POCT URINALYSIS DIP (DEVICE)
Ketones, ur: NEGATIVE mg/dL
Nitrite: NEGATIVE
Protein, ur: NEGATIVE mg/dL
Urobilinogen, UA: 0.2 mg/dL (ref 0.0–1.0)
pH: 6 (ref 5.0–8.0)

## 2012-08-17 MED ORDER — HYDROXYPROGESTERONE CAPROATE 250 MG/ML IM OIL
250.0000 mg | TOPICAL_OIL | Freq: Once | INTRAMUSCULAR | Status: AC
  Administered 2012-08-17: 250 mg via INTRAMUSCULAR

## 2012-08-17 MED ORDER — CEPHALEXIN 500 MG PO CAPS: 500.0000 mg | ORAL_CAPSULE | Freq: Four times a day (QID) | ORAL | Status: AC

## 2012-08-17 MED ORDER — INFLUENZA VIRUS VACC SPLIT PF IM SUSP
0.5000 mL | Freq: Once | INTRAMUSCULAR | Status: AC
  Administered 2012-08-17: 0.5 mL via INTRAMUSCULAR

## 2012-08-17 NOTE — Progress Notes (Signed)
C/O LLQ pain, off and on, more with movement and baby movement. Rev'd round ligament pain. Also has an erethematous area around injection site from last week's 17P injection. Started several days after. Is warm to touch. About 6cm with erethema.  Suspect cellulitis. Will try round of Keflex. Per Dr Erin Fulling, ok to give injection today. If has allergic reaction, will use Benadryl. No whole body symptoms. Wants flu shot today.

## 2012-08-17 NOTE — Progress Notes (Signed)
Pulse 79  c/o acute pain on left groin comes really strong for about 2 minutes then goes away and comes back; no c/o pressure.

## 2012-08-17 NOTE — Patient Instructions (Addendum)
Infecciones de la piel (Skin Infections) Una infeccin en la piel generalmente es el resultado de la ruptura de la barrera cutnea.  CAUSAS  Puede ser consecuencia de:  Un traumatismo o lesin en la piel, como un corte o la picadura de un insecto.  Inflamacin (como en el caso de eczema).  Cortes en la piel que se encuentra entre los dedos (como en el pie de atleta).  Hinchazn (edema). SNTOMAS Las piernas son Immunologist que con ms frecuencia se ve afectado. Generalmente se observa:  Enrojecimiento  Dentist  Puede haber lneas rojas en la zona de la infeccin. TRATAMIENTO  Las infecciones menores pueden tratarse con antibiticos en forma tpica, pero si la infeccin es grave, ser necesaria la hospitalizacin y un tratamiento con antibiticos por va intravenosa.  La mayor parte de las infecciones de la piel pueden tratarse con antibiticos por va oral y Administrator, Civil Service, y la elevacin de la zona afectada hasta que la infeccin mejore.  Si le recetan antibiticos por va oral, es importante tomarlos tal como se lo indiquen y debe tomar todas las tabletas incluso si se siente mejor antes de Art gallery manager.  Puede aplicarse compresas tibias durante 20 a 30 minutos, 4 veces por da. Deber aplicarse la vacuna contra el tetanos si:  No sabe cuando recibi la ltima dosis.  Nunca recibi esta vacuna.  La herida est sucia. Si usted necesita aplicarse la vacuna y se niega a recibirla, corre riesgo de contraer ttanos. sta es una enfermedad grave. Si le han aplicado la vacuna contra el ttanos, el brazo podr hincharse, enrojecer o subirle la temperatura en el lugar de la inyeccin. Esto es frecuente y no constituye un problema. SOLICITE ATENCIN MDICA SI: El dolor y la inflamacin no mejoran luego de 2 [place]. SOLICITE ATENCIN MDICA DE INMEDIATO SI: Tiene fiebre, escalofros, u otros problemas de importancia.  Document Released: 08/24/2005 Document Revised:  11/16/2011 [facility] Patient Information 2013 [place], [place]. Vacuna desactivada contra la influenza, Lo que usted necesita saber (Inactivated Influenza Vaccine, What You Need to Know) POR QU VACUNARSE?  La influenza (conocida como gripe o "flu") es una enfermedad contagiosa.  Es causada por el virus de la influenza, que se puede transmitir al toser, Engineering geologist o mediante las secreciones nasales.  A cualquiera le puede dar influenza, pero los ndices de infeccin son [employer] nios. La [employer] de las personas solo experimentan sntomas por unos pocos das e incluyen:  Teacher, English as a foreign language o escalofros.  Dolor de Advertising copywriter.  Dolores musculares.  Cansancio.  Tos.  Dolor de [place].  Nariz moquienta o congestionada. Otras enfermedades pueden DIRECTV mismos sntomas y a menudo se confunden con la influenza. Los nios pequeos, las [employer] de 65 aos de edad, las mujeres embarazadas y las personas con ciertas condiciones de salud, como enfermedades del corazn, pulmn o rin o un sistema inmunolgico debilitado, se pueden enfermar mucho ms. La influenza puede causar fiebre alta y neumona y puede empeorar condiciones de salud preexistentes. Puede causar diarrea y convulsiones en los nios. Miles de personas mueren cada ao por la influenza y muchas ms requieren hospitalizacin. Si se vacuna, puede protegerse usted mismo y Arts administrator a otros. VACUNA DESACTIVADA CONTRA LA INFLUENZA  Hay dos tipos de vacuna contra la influenza:  La vacuna inactivada (el virus est inactivo), de la "vacuna contra la gripe" se aplica con una aguja.  La vacuna viva atenuada (debilitado), que see aplica como roco en las fosas nasales. Esta vacuna se describe  en [employer] de Informacin sobre las [place], por separado. Hay una "dosis ms alta" de vacuna desactivada disponible para personas mayores de 65 aos. Para ms informacin, consulte a su doctor.  Cada ao los cientficos tratan de que los  virus de la vacuna coincidan con los que tienen ms probabilidades de causar la influenza ese ao. La vacuna contra la influenza no prevendr otras enfermedades causadas por otros virus, incluyendo los virus de influenza que no estn incluidos en la vacuna. Despus de la vacunacin, toma hasta 2 semanas para desarrollar proteccin. La proteccin dura hasta un ao. Algunas vacunas desactivadas contra la influenza contienen un conservante llamado timerosal. La vacuna libre de timerosal tambin est disponible. Consulte a su doctor para ms informacin. QUINES DEBEN RECIBIR LA VACUNA DESACTIVADA CONTRA LA INFLUENZA Y CUNDO? QUINES  Todas las [employer] de 6 meses de edad deben recibir la vacuna contra la influenza.  La vacunacin es especialmente importante para las personas con mayor riesgo de experimentar un caso grave de influenza y las que estn en contacto directo con ellas, incluyendo al personal mdico, y las personas en contacto cercano con bebs menores de 6 meses de edad. CUNDO Reciba la vacuna tan pronto como est disponible. Esto le dar la proteccin necesaria en caso de que la temporada de influenza llegue temprano. Puede vacunarse durante todo el tiempo en el que la enfermedad siga ocurriendo en su comunidad. La influenza puede ocurrir a Customer service manager, pero la [place] de influenza ocurre desde octubre [employer]. En las ltimas temporadas, la mayora de las infecciones han ocurrido en enero y Research scientist (physical sciences). Vacunndose [employer], o an despus, ser beneficioso en casi todos los [place]. Los adultos y los nios mayores requieren una dosis de la vacuna contra la influenza cada ao. Sin embargo, algunos nios menores de 9 aos de edad [address] dosis para estar protegidos. Consulte a su doctor. Se puede dar la vacuna contra la influenza a la misma vez que otras vacunas, incluyendo la vacuna antineumoccica. ALGUNAS PERSONAS NO DEBEN RECIBIR LA VACUNA DESACTIVADA CONTRA LA  INFLUENZA O DEBEN ESPERAR  Diga a su doctor si tiene cualquier alergia grave (que amenaza la vida), incluyendo alergia grave a los [place]. Una grave alergia a cualquier componente de la vacuna puede ser razn para no vacunarse. Las Therapist, art a la vacuna contra la influenza son poco comunes.  Diga a su doctor si alguna vez ha tenido una reaccin grave despus de haber recibido una dosis de la vacuna contra la influenza.  Diga a su doctor si alguna vez ha tenido el sndrome de Pension scheme manager (una enfermedad paraltica grave, tambin conocida como GBS). Su doctor le puede ayudar a decidir si es recomendable vacunarse.  Las personas moderadamente o muy enfermas por lo general deben esperar hasta recuperarse antes de vacunarse contra la influenza. Si est enfermo, hable con su doctor sobre si debe cambiar la cita para vacunarse. Las personas con una enfermedad leve por lo general se pueden vacunar. CULES SON LOS RIESGOS DE LA VACUNA DESACTIVADA CONTRA LA INFLUENZA? Los vacunas, como cualquier [place], pueden causar problemas serios, como Therapist, art graves. El riesgo de que la vacuna cause un dao serio, o la [place], es sumamente pequeo. Problemas serios de la vacuna desactivada contra la influenza ocurren muy rara vez. Los virus en la vacuna desactivada estn muertos o sea que no se puede enfermar de influenza mediante la vacuna. Problemas leves:  Molestia, enrojecimiento o hinchazn en el lugar donde lo vacunaron.  Ronquera; dolor,  enrojecimiento y [employer] ojos; tos.  Grant Ruts.  Dolores.  Dolor de [place].  Picazn.  Cansancio. Si estos problemas ocurren, en general comienzan poco tiempo despus de vacunarse y duran 1  2 das. Problemas moderados: Los nios pequeos que reciben la vacuna contra la influenza desactivada y la vacuna antineumoccica (PCV13) durante la misma cita parecen correr mayor riego de tener convulsiones por causa de fiebre. Consulte a su  doctor para ms informacin. Diga a su doctor si el nio que est recibiendo la vacuna contra la influenza ha tenido una convulsin. Problemas graves:  Las reacciones alrgicas que amenazan la vida ocurren muy rara vez despus de la vacunacin. Si ocurren, por lo general es a los [employer] o a las pocas horas de haberse vacunado.  En 1976, un tipo de vacuna contra la influenza (gripe porcina) estuvo asociado al sndrome de Guillain-Barr (GBS). Desde entonces, las vacunas contra la influenza no se han asociado claramente al GBS. Sin embargo, si hay un riesgo de GBS por las vacunas contra la influenza que se usan actualmente, no debe ser ms de 1  2 casos por milln de personas vacunadas. Eso es [employer] que el riesgo de tener una influenza fuerte, que se puede prevenir con vacunacin. Siempre se seguir prestando atencin a la seguridad de las vacunas. Para ms informacin visite:  PrintingMaps.se y  [email or web address].farmer-stevens.info/ [place] marca de la vacuna desactivada contra la influenza, llamada Afluria, no se debe dar a nios menores de 8 aos de edad, con la excepcin de circunstancias especiales. En [place] una vacuna relacionada estuvo asociada a fiebre y convulsiones febriles en nios pequeos. Su doctor le puede proporcionar ms informacin. QU PASA SI HAY UNA REACCIN GRAVE? A qu debo prestar atencin? Cualquier estado poco habitual, como fiebre alta o cambios en el comportamiento. Los signos de [place] reaccin alrgica grave pueden incluir dificultad para respirar, ronquera o sibilancias, ronchas, palidez, debilidad, latidos cardacos acelerados, o mareos. Qu debo hacer?  Llame a un doctor o lleve a la persona inmediatamente a un doctor.  Dga a su doctor lo que ocurri, la fecha y hora en que ocurri, y cuando recibi la vacuna.  Pida a su mdico, enfermero o al departamento de salud, que informe sobre la  reaccin llenando un formulario del Sistema de Informacin de Reacciones Adversos a las Administrator, arts (VAERS, por sus siglas en ingls). O, puede presentar este informe mediante el sitio Web de VAERS, en:www.vaers.LAgents.no o llamando al: ([phone] [phone]. VAERS no proporciona consejos mdicos. PROGRAMA NACIONAL DE COMPENSACIN POR LESIONES CAUSADAS POR VACUNAS El [place] de Compensacin por Lesiones Causadas por las Vacunas (VICP) fue creado en 1986.  Las personas que piensan haber sido lesionadas por alguna vacuna pueden aprender acerca del programa y cmo presentar una reclamacin llamando al: [phone] o visitando el sitio Web de VICP [employer].[email or web address] CMO Roxan Diesel MS INFORMACIN?  Consulte a su doctor. Le pueden dar el folleto de informacin que viene con la vacuna o sugerirle otras fuentes de informacin.  Llame al departamento de salud local o estatal.  Comunquese con los Centros para el Control y la Prevencin de [place] (CDC):  Llame al [phone] (1-800-CDC-INFO) o  Visite el sitio Web de los CDC en BiotechRoom.com.cy CDC Inactivated Influenza Vaccine-Spanish VIS (03/09/11) Document Released: 11/20/2008 Document Revised: 11/16/2011 [facility] Patient Information 2013 [place], [place].

## 2012-08-18 ENCOUNTER — Encounter: Payer: Self-pay | Admitting: Advanced Practice Midwife

## 2012-08-25 ENCOUNTER — Ambulatory Visit (INDEPENDENT_AMBULATORY_CARE_PROVIDER_SITE_OTHER): Payer: Self-pay | Admitting: *Deleted

## 2012-08-25 ENCOUNTER — Telehealth: Payer: Self-pay | Admitting: *Deleted

## 2012-08-25 VITALS — BP 106/57 | HR 85 | Resp 20 | Wt 198.9 lb

## 2012-08-25 DIAGNOSIS — O09219 Supervision of pregnancy with history of pre-term labor, unspecified trimester: Secondary | ICD-10-CM

## 2012-08-25 DIAGNOSIS — O09899 Supervision of other high risk pregnancies, unspecified trimester: Secondary | ICD-10-CM

## 2012-08-25 MED ORDER — HYDROXYPROGESTERONE CAPROATE 250 MG/ML IM OIL
250.0000 mg | TOPICAL_OIL | Freq: Once | INTRAMUSCULAR | Status: AC
  Administered 2012-08-25: 250 mg via INTRAMUSCULAR

## 2012-08-25 MED ORDER — HYDROXYPROGESTERONE CAPROATE 250 MG/ML IM OIL: 250.0000 mg | TOPICAL_OIL | Freq: Once | INTRAMUSCULAR | Status: DC

## 2012-09-01 ENCOUNTER — Other Ambulatory Visit: Payer: Self-pay | Admitting: Advanced Practice Midwife

## 2012-09-01 ENCOUNTER — Ambulatory Visit (INDEPENDENT_AMBULATORY_CARE_PROVIDER_SITE_OTHER): Payer: Self-pay | Admitting: Family Medicine

## 2012-09-01 VITALS — BP 101/56 | Temp 96.6°F | Wt 200.7 lb

## 2012-09-01 DIAGNOSIS — O09219 Supervision of pregnancy with history of pre-term labor, unspecified trimester: Secondary | ICD-10-CM

## 2012-09-01 DIAGNOSIS — O3400 Maternal care for unspecified congenital malformation of uterus, unspecified trimester: Secondary | ICD-10-CM

## 2012-09-01 LAB — CBC
MCV: 90.3 fL (ref 78.0–100.0)
Platelets: 373 10*3/uL (ref 150–400)
RBC: 4.13 MIL/uL (ref 3.87–5.11)
RDW: 13.7 % (ref 11.5–15.5)
WBC: 10.9 10*3/uL — ABNORMAL HIGH (ref 4.0–10.5)

## 2012-09-01 LAB — POCT URINALYSIS DIP (DEVICE)
Bilirubin Urine: NEGATIVE
Leukocytes, UA: NEGATIVE
Nitrite: NEGATIVE
Protein, ur: NEGATIVE mg/dL
pH: 6 (ref 5.0–8.0)

## 2012-09-01 MED ORDER — HYDROXYPROGESTERONE CAPROATE 250 MG/ML IM OIL
250.0000 mg | TOPICAL_OIL | Freq: Once | INTRAMUSCULAR | Status: AC
  Administered 2012-09-01: 250 mg via INTRAMUSCULAR

## 2012-09-01 NOTE — Patient Instructions (Addendum)
Dolor del ligamento redondo (Round Ligament Pain) El ligamento redondo se compone de msculo y tejido fibroso. Est unido al tero cerca de las trompas de [place] El ligamento redondo est ubicado en ambos lados del tero y [place] a Pharmacologist su posicin. Normalmente comienza en el segundo trimestre del embarazo cuando el tero sale hacia afuera de la pelvis. El dolor puede aparecer y desaparecer hasta el nacimiento del beb. El dolor de ligamento redondo no es un problema serio y no ocasiona daos al beb. CAUSAS Durante el Consulting civil engineer tero crece mayormente desde el segundo trimestre [place]. A medida que crece, se estira y tuerce ligeramente los ligamentos. Cuando el tero ejerce presin en ambos lados, el ligamento redondo del lado opuesto presiona y se Psychologist, occupational. Esto causa dolor. SNTOMAS El dolor puede ocurrir en uno o ambos lados. El dolor es por lo general como un pellizco corto y filoso. A veces puede ser un dolor opaco y persistente. Se siente en la parte baja del abdomen o en la ingle. Es un Adult nurse, y por lo general comienza en la ingle y se mueve hacia la zona de la cadera. El dolor puede ocurrir con:  Chief of Staff de posicin repentino como el levantarse de la cama o de una silla.  Darse vuelta en la cama.  Toser o estornudar.  Caminar demasiado.  Cualquier tipo de actividad fsica. DIAGNSTICO El medico deber asegurarse de que no existen problemas graves que Audiological scientist. Si no encuentra nada grave, los sntomas suelen indicar de que se trata de un dolor proveniente del ligamento redondo. TRATAMIENTO  Sintese y reljese cuando el dolor comience.  Llevar las rodillas [employer].  Recustese sobre un lado con una almohada debajo del vientre (abdomen) y Oletta Lamas sus piernas.  Sintese en un bao caliente de 15 a 20 minutos o hasta que el dolor desaparezca. INSTRUCCIONES PARA EL CUIDADO DOMICILIARIO  Utilice los medicamentos de venta libre o de  prescripcin para Chief Technology Officer, el malestar o la [place], segn se lo indique el profesional que lo asiste.  Sintese y pngase de pie lentamente.  Evite caminatas largas si le ocasionan dolor.  Detenga o disminuya las actividades fsicas si Sports administrator. SOLICITE ATENCIN MDICA SI:  El dolor no desaparece con estas medidas.  Necesita que le prescriban medicamentos ms fuertes para Chief Technology Officer.  Desarrolla un dolor de espalda que no haba sentido antes junto con el de lado. SOLICITE ATENCIN MDICA DE INMEDIATO SI:  La temperatura se eleva por encima de 102 F (38.9 C) o ms.  Siente contracciones uterinas.  Presenta hemorragia vaginal.  Presenta nuseas, diarrea o vmitos.  Comienza a sentir escalofros  Electronics engineer al [employer]. Document Released: 08/06/2008 Document Revised: 11/16/2011 [facility] Patient Information 2013 [place], [place].   Embarazo  Systems analyst trimestre  (Pregnancy - Third Trimester) El tercer trimestre del Psychiatrist (los ltimos 3 meses) es el perodo en el cual tanto usted como su beb crecen con ms rapidez. El beb alcanza un largo de aproximadamente 50 cm. y pesa entre 2,700 y 4,500 kg. El beb gana ms tejido graso y est listo para la vida fuera del cuerpo de la [place]. Mientras estn en el interior, los bebs tienen perodos de sueo y vigilia, Warehouse manager y tienen hipo. Quizs sienta pequeas contracciones del tero. Este es el falso trabajo de [place]. Tambin se las conoce como contracciones de [place] . Es como una prctica del parto. Los problemas ms habituales de esta etapa del  embarazo incluyen mayor dificultad para respirar, hinchazn de las manos y los pies por retencin de lquidos y la necesidad de Geographical information systems officer con ms frecuencia debido a que el tero y el beb presionan sobre la vejiga.  EXAMENES PRENATALES   Durante los [employer], deber seguir realizndose anlisis de [place]. Estas pruebas se realizan para controlar su salud y la  del beb. Los [employer] Corporation de sangre se Radiographer, therapeutic para [employer] niveles de algunos compuestos de la sangre (hemoglobina). La anemia (bajo nivel de hemoglobina) es frecuente durante el embarazo. Para prevenirla, se administran hierro y vitaminas. Tambin le tomarn nuevas anlisis para descartar diabetes. Podrn repetirle algunas de las [employer] Enterprises hicieron previamente.  En cada visita le medirn el tamao del tero. Esto permite asegurar que el beb se desarrolla adecuadamente, segn la fecha del embarazo.  Le controlarn la presin arterial en cada visita prenatal. Esto es para asegurarse de que no sufre toxemia.  Le harn un anlisis de orina en cada visita prenatal, para descartar infecciones, diabetes y la presencia de protenas.  Tambin en cada visita controlarn su peso. Esto se realiza para asegurarse que aumenta de peso al ritmo indicado y que usted y su beb evolucionan normalmente.  En algunas ocasiones se realiza una prueba de ultrasonido para confirmar el correcto desarrollo y evolucin del beb. Esta prueba se realiza con ondas sonoras inofensivas para el beb, de modo que el profesional pueda calcular ms precisamente la fecha del [place].  Analice con su mdico los analgsicos y la anestesia que recibir durante el [place] de parto y [place].  Comente la posibilidad de que necesite una cesrea y qu anestesia se recibir.  Informe a su mdico si sufre violencia familiar mental o fsica. A veces, se indica la prueba especializada sin estrs, la prueba de tolerancia a las contracciones y el perfil biofsico para asegurarse de que el beb no tiene problemas. El estudio del lquido amnitico que rodea al beb se llama amniocentesis. El lquido amnitico se obtiene introduciendo una aguja en el vientre (abdomen ). En ocasiones se lleva a cabo cerca del final del embarazo, si es necesario inducir a un parto. En este caso se realiza para asegurarse que los pulmones del beb estn lo  suficientemente maduros como para que pueda vivir fuera del tero. Si los pulmones no han madurado y es peligroso que el beb nazca, se Building services engineer a la madre una inyeccin de [place] , 1 a 2 [address]  [address] antes del [address]. Vivia Budge ayuda a que los pulmones del beb maduren y sea ms seguro su nacimiento.  CAMBIOS QUE OCURREN EN EL TERCER TRIMESTRE DEL EMBARAZO  Su organismo atravesar numerosos cambios durante el [place]. Estos pueden variar de Neomia Dear persona a otra. [place] con el profesional que la asiste acerca los cambios que usted note y que la preocupen.   Durante el ltimo trimestre probablemente sienta un aumento del apetito. Es normal tener "antojos" de Development worker, community. Esto vara de Neomia Dear persona a otra y de un embarazo a Therapist, art.  Podrn aparecer las primeras estras en las caderas, abdomen y [place]. Estos son cambios normales del cuerpo durante el [place]. No existen medicamentos ni ejercicios que puedan prevenir [employer].  La constipacin puede tratarse con un laxante o agregando fibra a su dieta. Beber grandes cantidades de lquidos, tomar fibras en forma de vegetales, frutas y granos integrales es de gran [place].  Tambin es beneficioso practicar actividad fsica. Si ha sido una persona activa hasta el [place], podr continuar con  la Har[employer]son de las actividades durante el mismo. Si ha sido [employer], puede ser beneficioso que comience con un programa de ejercicios, Museum/gallery exhibitions officer. Consulte con el profesional que la asiste antes de comenzar un programa de ejercicios.  Evite el consumo de cigarrillos, el alcohol, los medicamentos no recetados y las "drogas de la calle" durante el Psychiatrist. Estas sustancias qumicas afectan la formacin y el desarrollo del beb. Evite estas sustancias durante todo el embarazo para asegurar el nacimiento de un beb sano.  Podr sentir dolor de espalda, tener vrices en las venas y hemorroides, o si ya los sufra, pueden [place].  Durante el tercer  trimestre se cansar con ms facilidad, lo cual es normal.  Los movimientos del beb pueden ser ms fuertes y con ms frecuencia.  Puede que note dificultades para respirar normalmente.  El ombligo puede salir hacia afuera.  A veces sale Veterinary surgeon de las [place], que se llama Product manager.  Podr aparecer Neomia Dear secrecin mucosa con sangre. Esto suele ocurrir General Electric unos [address] y Neomia Dear semana antes del [place]. INSTRUCCIONES PARA EL CUIDADO EN EL HOGAR   Cumpla con las citas de control. Siga las indicaciones del mdico con respecto al uso de [place], los ejercicios y la dieta.  Durante el embarazo debe obtener nutrientes para usted y para su beb. Consuma alimentos balanceados a intervalos regulares. Elija alimentos como carne, pescado, [place] y otros productos lcteos descremados, vegetales, frutas, panes integrales y cereales. El [employer] informar cul es el aumento de peso ideal.  Las relaciones sexuales pueden continuarse hasta casi el final del embarazo, si no se presentan otros problemas como prdida prematura (antes de [place]) de lquido amnitico, hemorragia vaginal o dolor en el vientre (abdominal).  Realice Tesoro Corporation, si no tiene restricciones. Consulte con el profesional que la asiste si no sabe con certeza si determinados ejercicios son seguros. El mayor aumento de peso se producir en los ltimos 2 trimestres del Psychiatrist. El ejercicio ayuda a:  Engineering geologist.  Mantenerse en forma para el trabajo de parto y [place] .  Perder peso despus del parto.  Haga reposo con frecuencia, con las piernas elevadas, o segn lo necesite para evitar los calambres y el dolor de cintura.  Use un buen sostn o como los que se usan para hacer deportes para Paramedic la sensibilidad de las [place]. Tambin puede serle til si lo [place] mientras duerme. Si pierde Product manager, podr [employer]in.  No utilice la baera con agua caliente, baos turcos y  saunas.  Colquese el cinturn de seguridad cuando conduzca. Este la proteger a usted y al beb en caso de accidente.  Evite comer carne cruda y el contacto con los utensilios y desperdicios de los gatos. Estos elementos contienen grmenes que pueden causar defectos de nacimiento en el beb.  Es fcil perder algo de orina durante el [place]. Apretar y Chief Operating Officer los msculos de la pelvis la ayudar con este problema. Practique detener la miccin cuando est en el bao. Estos son los mismos msculos que Development worker, international aid. Son [employer] Partners mismos msculos que utiliza cuando trata de evitar despedir gases. Puede practicar apretando estos msculos Well[employer], y repetir esto tres veces por da aproximadamente. Una vez que conozca qu msculos debe apretar, no realice estos ejercicios durante la miccin. Puede favorecerle una infeccin si la orina vuelve hacia atrs.  Pida ayuda si tienen necesidades financieras, teraputicas o nutricionales. El profesional podr ayudarla con respecto a  estas necesidades, o derivarla a otros especialistas.  Haga una lista de nmeros telefnicos de emergencia y tngalos disponibles.  Planifique como obtener ayuda de familiares o amigos cuando regrese a Programmer, applications hospital.  Hacer un ensayo sobre la partida al hospital.  [place] clases prenatales con el padre para entender, practicar y hacer preguntas sobre el [place] de parto y el alumbramiento.  Preparar la habitacin del beb / busque Fatima Blank.  No viaje fuera de la ciudad a menos que sea absolutamente necesario y con el asesoramiento de su mdico.  Use slo zapatos de tacn bajo o sin tacn para tener mejor equilibrio y Automotive engineer cadas. USO DE MEDICAMENTOS Y CONSUMO DE DROGAS DURANTE EL [facility]   Tome las vitaminas apropiadas para esta etapa tal como se le indic. Las vitaminas deben contener un miligramo de cido flico. Guarde todas las vitaminas fuera del alcance de los nios. La ingestin de slo un  par de vitaminas o tabletas que contengan hierro pueden ocasionar la [employer] Mining en un beb o en un nio pequeo.  Evite el uso de [employer] Mutual of Omaha, incluyendo hierbas, medicamentos de [place], sin receta o que no hayan sido sugeridos por su mdico. Slo tome medicamentos de venta libre o medicamentos recetados para Chief Technology Officer, Environmental health practitioner o fiebre como lo indique su mdico. No tome aspirina, ibuprofeno (Motrin, Advil, Nuprin) o naproxeno (Aleve) excepto que su mdico se lo indique.  Infrmele al profesional si consume alguna droga.  El alcohol se relaciona con ciertos defectos congnitos. Incluye el sndrome de alcoholismo fetal. Debe evitar absolutamente el consumo de alcohol, en cualquier forma. El fumar produce baja tasa de natalidad y bebs prematuros.  Las drogas ilegales o de la calle son muy perjudiciales para el beb. Estn absolutamente prohibidas. Un beb que nace de [employer] Express, ser adicto al nacer. Ese beb tendr los mismos sntomas de abstinencia que un adulto. SOLICITE ATENCIN MDICA SI:  Tiene preguntas o preocupaciones relacionadas con el embarazo. Es mejor que llame para formular las preguntas si no puede esperar hasta la prxima visita, que sentirse preocupada por ellas.  DECISIONES ACERCA DE LA CIRCUNCISIN  Usted puede saber o no cul es el sexo de su beb. Si ya sabe que ser un varn, este es el momento de pensar acerca de la circuncisin. La circuncisin es la extirpacin del prepucio. Esta es la piel que cubre el extremo sensible del pene. No hay un motivo mdico que lo justifique. Generalmente la decisin se toma segn lo que sea popular en ese momento, o segn creencias religiosas. Podr conversar estos temas con su mdico o con el pediatra.  SOLICITE ATENCIN MDICA DE INMEDIATO SI:   La temperatura oral le sube a ms de 102 F (38.9 C) o lo que su mdico le indique.  Tiene una prdida de lquido por la vagina (canal de parto). Si sospecha una ruptura de  las [place], tmese la temperatura y llame al profesional para informarlo sobre esto.  Observa unas pequeas manchas, una hemorragia vaginal o elimina cogulos. Notifique al profesional acerca de la cantidad y de cuntos apsitos est utilizando.  Presenta un olor desagradable en la secrecin vaginal y observa un cambio en el color, de transparente a blanco.  Ha vomitado durante ms de 24 horas.  Siente escalofros o le sube la fiebre.  Le falta el aire.  Siente ardor al Beatrix Shipper.  Baja o sube ms de 2 libras (900 g), o segn lo indicado por el profesional que la asiste.  Observa que sbitamente se le hinchan el rostro, las manos, los pies o las piernas.  Siente dolor en el vientre (abdominal). Las [employer]ul en el ligamento redondo son Neomia Dear causa benigna frecuente de dolor abdominal durante el embarazo. El profesional que la asiste deber evaluarla.  Presenta dolor de cabeza intenso que no se [place].  Tiene problemas visuales, visin doble o borrosa.  Si no siente los movimientos del beb durante ms de 1 hora. Si piensa que el beb no se mueve tanto como lo haca habitualmente, coma algo que Psychologist, clinical y Target Corporation lado izquierdo durante [place]. El beb debe moverse al menos 4  5 veces por hora. Comunquese inmediatamente si el beb se mueve menos que lo indicado.  Se cae, se ve involucrada en un accidente automovilstico o sufre algn tipo de traumatismo.  En su hogar hay violencia mental o fsica. Document Released: 06/03/2005 Document Revised: 02/23/2012 [facility] Patient Information 2013 [place], [place].

## 2012-09-01 NOTE — Progress Notes (Signed)
Pulse 96 Patient still reporting LLQ pain when she lies down  Needs CBC, RPR, HIV, 1 hr gtt @ 1135

## 2012-09-01 NOTE — Progress Notes (Signed)
On 17-P for hx 3 preterm births. Septate uterus. Has f/u growth ultrasound scheduled with MFM tomorrow.

## 2012-09-02 ENCOUNTER — Encounter: Payer: Self-pay | Admitting: Family Medicine

## 2012-09-02 ENCOUNTER — Ambulatory Visit (HOSPITAL_COMMUNITY)
Admission: RE | Admit: 2012-09-02 | Discharge: 2012-09-02 | Disposition: A | Discharge status: Home / Self Care | Payer: Self-pay | Source: Ambulatory Visit | Attending: Advanced Practice Midwife | Admitting: Advanced Practice Midwife

## 2012-09-02 VITALS — BP 102/52 | HR 95 | Wt 200.2 lb

## 2012-09-02 DIAGNOSIS — O34219 Maternal care for unspecified type scar from previous cesarean delivery: Secondary | ICD-10-CM | POA: Insufficient documentation

## 2012-09-02 DIAGNOSIS — Z8751 Personal history of pre-term labor: Secondary | ICD-10-CM | POA: Insufficient documentation

## 2012-09-02 DIAGNOSIS — O09219 Supervision of pregnancy with history of pre-term labor, unspecified trimester: Secondary | ICD-10-CM

## 2012-09-02 DIAGNOSIS — E669 Obesity, unspecified: Secondary | ICD-10-CM | POA: Insufficient documentation

## 2012-09-02 DIAGNOSIS — O3400 Maternal care for unspecified congenital malformation of uterus, unspecified trimester: Secondary | ICD-10-CM | POA: Insufficient documentation

## 2012-09-02 LAB — RPR

## 2012-09-02 NOTE — Progress Notes (Signed)
Janice Chase  was seen today for an ultrasound appointment.  See full report in [facility].  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 She is currently receiving weekly 17-P injections.  We briefly discussed the association of uterine anomalies and preterm delivery.  The patient reports that she has never previously been informed of a uterine anomaly, and I cannot find any clear documentation of this finding in her medical record.  Based on this information, feel that it would be reasonable for the patient to undergo an imaging study (sonohystogram) post partum to better evaluate the uterine cavity.  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  If  growth is appropriate at that time, follow up ultrasounds as clinically indicated.  Alpha Gula, MD

## 2012-09-02 NOTE — Telephone Encounter (Signed)
Opened in error

## 2012-09-07 HISTORY — DX: Maternal care for unspecified type scar from previous cesarean delivery: O34.219

## 2012-09-07 NOTE — L&D Delivery Note (Signed)
Attestation of Attending Supervision of Advanced Practitioner (CNM/NP): Evaluation and management procedures were performed by the Advanced Practitioner under my supervision and collaboration.  I have reviewed the Advanced Practitioner's note and chart, and I agree with the management and plan.  HARRAWAY-SMITH, CAROLYN 10:54 AM

## 2012-09-07 NOTE — L&D Delivery Note (Signed)
Delivery Note DUe to difficulty in assessing contractions/?early vs late decels, AROM was performed with clear fluid and IUPC/FSE were applied.  PT was 5/80/-2 at this time.  2 contractions later, with massive pushing on the pt's part, she quickly delivered through a now completely dilated cervix.  At 9:29 PM a viable female was delivered via Vaginal, Spontaneous Delivery (Presentation: Right Occiput Anterior).  APGAR: 9/9 ; weight pending. 20 Units of pitocin was infused rapidly IV and the placenta delivered a few minutes later, intact with 3 vessels with the following complications: None.   Anesthesia:  none Episiotomy: none Lacerations: 1st degree perineal Suture Repair: 2.0 vicryl, one stitch Est. Blood Loss (mL): 250  Mom to postpartum.  Baby to nursery-stable  AROM, IPUC/FSE placement, delivery and repair done by Leandro Reasoner, SNM, under my supervision.  CRESENZO-DISHMAN,FRANCES 11/10/2012, 9:41 PM

## 2012-09-09 ENCOUNTER — Ambulatory Visit (INDEPENDENT_AMBULATORY_CARE_PROVIDER_SITE_OTHER): Payer: Self-pay

## 2012-09-09 VITALS — BP 93/59 | HR 89 | Wt 199.9 lb

## 2012-09-09 DIAGNOSIS — O09219 Supervision of pregnancy with history of pre-term labor, unspecified trimester: Secondary | ICD-10-CM

## 2012-09-09 MED ORDER — HYDROXYPROGESTERONE CAPROATE 250 MG/ML IM OIL
250.0000 mg | TOPICAL_OIL | Freq: Once | INTRAMUSCULAR | Status: AC
  Administered 2012-09-09: 250 mg via INTRAMUSCULAR

## 2012-09-15 ENCOUNTER — Encounter: Payer: Self-pay | Admitting: *Deleted

## 2012-09-15 ENCOUNTER — Encounter: Payer: Self-pay | Admitting: Family Medicine

## 2012-09-15 ENCOUNTER — Ambulatory Visit (INDEPENDENT_AMBULATORY_CARE_PROVIDER_SITE_OTHER): Payer: Self-pay | Admitting: Family Medicine

## 2012-09-15 VITALS — BP 114/75 | Temp 96.6°F | Wt 200.7 lb

## 2012-09-15 DIAGNOSIS — O09219 Supervision of pregnancy with history of pre-term labor, unspecified trimester: Secondary | ICD-10-CM

## 2012-09-15 LAB — POCT URINALYSIS DIP (DEVICE)
Glucose, UA: NEGATIVE mg/dL
Hgb urine dipstick: NEGATIVE
Nitrite: NEGATIVE
Protein, ur: NEGATIVE mg/dL
Urobilinogen, UA: 0.2 mg/dL (ref 0.0–1.0)

## 2012-09-15 MED ORDER — HYDROXYPROGESTERONE CAPROATE 250 MG/ML IM OIL
250.0000 mg | TOPICAL_OIL | Freq: Once | INTRAMUSCULAR | Status: AC
  Administered 2012-09-15: 250 mg via INTRAMUSCULAR

## 2012-09-15 NOTE — Progress Notes (Signed)
Cervix feel very unlabored.--PTL precautions reviewed Last U/S showed approp. Growth.

## 2012-09-15 NOTE — Progress Notes (Signed)
Pulse- 92 Patient reports "very frequent urination but can only get a little bit out; feels like I have to push it out"; also notes odorous urine; also reports pelvic pressure & pain; had contractions on Sunday morning and voices concern about this- "didn't come in because it went away in an hour & a half"

## 2012-09-15 NOTE — Patient Instructions (Addendum)
Embarazo  Systems analyst trimestre  (Pregnancy - Third Trimester) El tercer trimestre del Psychiatrist (los ltimos 3 meses) es el perodo en el cual tanto usted como su beb crecen con ms rapidez. El beb alcanza un largo de aproximadamente 50 cm. y pesa entre 2,700 y 4,500 kg. El beb gana ms tejido graso y est listo para la vida fuera del cuerpo de la [place]. Mientras estn en el interior, los bebs tienen perodos de sueo y vigilia, Warehouse manager y tienen hipo. Quizs sienta pequeas contracciones del tero. Este es el falso trabajo de [place]. Tambin se las conoce como contracciones de [place] . Es como una prctica del parto. Los problemas ms habituales de esta etapa del embarazo incluyen mayor dificultad para respirar, hinchazn de las manos y los pies por retencin de lquidos y la necesidad de Geographical information systems officer con ms frecuencia debido a que el tero y el beb presionan sobre la vejiga.  EXAMENES PRENATALES   Durante los Manpower Inc, deber seguir realizndose anlisis de [place]. Estas pruebas se realizan para controlar su salud y la del beb. Los ARAMARK Corporation de sangre se Radiographer, therapeutic para [employer] Northwestern Mutual niveles de algunos compuestos de la sangre (hemoglobina). La anemia (bajo nivel de hemoglobina) es frecuente durante el embarazo. Para prevenirla, se administran hierro y vitaminas. Tambin le tomarn nuevas anlisis para descartar diabetes. Podrn repetirle algunas de las Hovnanian Enterprises hicieron previamente.  En cada visita le medirn el tamao del tero. Esto permite asegurar que el beb se desarrolla adecuadamente, segn la fecha del embarazo.  Le controlarn la presin arterial en cada visita prenatal. Esto es para asegurarse de que no sufre toxemia.  Le harn un anlisis de orina en cada visita prenatal, para descartar infecciones, diabetes y la presencia de protenas.  Tambin en cada visita controlarn su peso. Esto se realiza para asegurarse que aumenta de peso al ritmo indicado y que usted y  su beb evolucionan normalmente.  En algunas ocasiones se realiza una prueba de ultrasonido para confirmar el correcto desarrollo y evolucin del beb. Esta prueba se realiza con ondas sonoras inofensivas para el beb, de modo que el profesional pueda calcular ms precisamente la fecha del [place].  Analice con su mdico los analgsicos y la anestesia que recibir durante el [place] de parto y [place].  Comente la posibilidad de que necesite una cesrea y qu anestesia se recibir.  Informe a su mdico si sufre violencia familiar mental o fsica. A veces, se indica la prueba especializada sin estrs, la prueba de tolerancia a las contracciones y el perfil biofsico para asegurarse de que el beb no tiene problemas. El estudio del lquido amnitico que rodea al beb se llama amniocentesis. El lquido amnitico se obtiene introduciendo una aguja en el vientre (abdomen ). En ocasiones se lleva a cabo cerca del final del embarazo, si es necesario inducir a un parto. En este caso se realiza para asegurarse que los pulmones del beb estn lo suficientemente maduros como para que pueda vivir fuera del tero. Si los pulmones no han madurado y es peligroso que el beb nazca, se Building services engineer a la madre una inyeccin de [place] , 1 a 2 [address]  [address] antes del [address]. Vivia Budge ayuda a que los pulmones del beb maduren y sea ms seguro su nacimiento.  CAMBIOS QUE OCURREN EN EL TERCER TRIMESTRE DEL EMBARAZO  Su organismo atravesar numerosos cambios durante el [place]. Estos pueden variar de Neomia Dear persona a otra. Converse con el profesional que la asiste acerca los cambios que  usted note y que la preocupen.   Durante el ltimo trimestre probablemente sienta un aumento del apetito. Es normal tener "antojos" de Development worker, community. Esto vara de Neomia Dear persona a otra y de un embarazo a Therapist, art.  Podrn aparecer las primeras estras en las caderas, abdomen y [place]. Estos son cambios normales del cuerpo durante el [place]. No existen  medicamentos ni ejercicios que puedan prevenir CarMax.  La constipacin puede tratarse con un laxante o agregando fibra a su dieta. Beber grandes cantidades de lquidos, tomar fibras en forma de vegetales, frutas y granos integrales es de gran [place].  Tambin es beneficioso practicar actividad fsica. Si ha sido una persona Engineer, mining, podr continuar con la Harley-Davidson de las actividades durante el mismo. Si ha sido American Family Insurance, puede ser beneficioso que comience con un programa de ejercicios, Museum/gallery exhibitions officer. Consulte con el profesional que la asiste antes de comenzar un programa de ejercicios.  Evite el consumo de cigarrillos, el alcohol, los medicamentos no recetados y las "drogas de la calle" durante el Psychiatrist. Estas sustancias qumicas afectan la formacin y el desarrollo del beb. Evite estas sustancias durante todo el embarazo para asegurar el nacimiento de un beb sano.  Podr sentir dolor de espalda, tener vrices en las venas y hemorroides, o si ya los sufra, pueden [place].  Durante el tercer trimestre se cansar con ms facilidad, lo cual es normal.  Los movimientos del beb pueden ser ms fuertes y con ms frecuencia.  Puede que note dificultades para respirar normalmente.  El ombligo puede salir hacia afuera.  A veces sale Veterinary surgeon de las [place], que se llama Product manager.  Podr aparecer Neomia Dear secrecin mucosa con sangre. Esto suele ocurrir General Electric unos [address] y Neomia Dear semana antes del [place]. INSTRUCCIONES PARA EL CUIDADO EN EL HOGAR   Cumpla con las citas de control. Siga las indicaciones del mdico con respecto al uso de [place], los ejercicios y la dieta.  Durante el embarazo debe obtener nutrientes para usted y para su beb. Consuma alimentos balanceados a intervalos regulares. Elija alimentos como carne, pescado, [place] y otros productos lcteos descremados, vegetales, frutas, panes integrales y cereales. El Office Depot informar  cul es el aumento de peso ideal.  Las relaciones sexuales pueden continuarse hasta casi el final del embarazo, si no se presentan otros problemas como prdida prematura (antes de [place]) de lquido amnitico, hemorragia vaginal o dolor en el vientre (abdominal).  Realice Tesoro Corporation, si no tiene restricciones. Consulte con el profesional que la asiste si no sabe con certeza si determinados ejercicios son seguros. El mayor aumento de peso se producir en los ltimos 2 trimestres del Psychiatrist. El ejercicio ayuda a:  Engineering geologist.  Mantenerse en forma para el trabajo de parto y [place] .  Perder peso despus del parto.  Haga reposo con frecuencia, con las piernas elevadas, o segn lo necesite para evitar los calambres y el dolor de cintura.  Use un buen sostn o como los que se usan para hacer deportes para Paramedic la sensibilidad de las [place]. Tambin puede serle til si lo [place] mientras duerme. Si pierde Product manager, podr Parker Hannifin.  No utilice la baera con agua caliente, baos turcos y saunas.  Colquese el cinturn de seguridad cuando conduzca. Este la proteger a usted y al beb en caso de accidente.  Evite comer carne cruda y el contacto con los utensilios y desperdicios de los gatos. Estos elementos  contienen grmenes que pueden causar defectos de nacimiento en el beb.  Es fcil perder algo de orina durante el [place]. Apretar y Chief Operating Officer los msculos de la pelvis la ayudar con este problema. Practique detener la miccin cuando est en el bao. Estos son los mismos msculos que Development worker, international aid. Son TEPPCO Partners mismos msculos que utiliza cuando trata de evitar despedir gases. Puede practicar apretando estos msculos WellPoint, y repetir esto tres veces por da aproximadamente. Una vez que conozca qu msculos debe apretar, no realice estos ejercicios durante la miccin. Puede favorecerle una infeccin si la orina vuelve hacia  atrs.  Pida ayuda si tienen necesidades financieras, teraputicas o nutricionales. El profesional podr ayudarla con respecto a estas necesidades, o derivarla a otros especialistas.  Haga una lista de nmeros telefnicos de emergencia y tngalos disponibles.  Planifique como obtener ayuda de familiares o amigos cuando regrese a Programmer, applications hospital.  Hacer un ensayo sobre la partida al hospital.  [place]ley clases prenatales con el padre para entender, practicar y hacer preguntas sobre el [place] de parto y el alumbramiento.  Preparar la habitacin del beb / busque Fatima Blank.  No viaje fuera de la ciudad a menos que sea absolutamente necesario y con el asesoramiento de su mdico.  Use slo zapatos de tacn bajo o sin tacn para tener mejor equilibrio y Automotive engineer cadas. USO DE MEDICAMENTOS Y CONSUMO DE DROGAS DURANTE EL [facility]   Tome las vitaminas apropiadas para esta etapa tal como se le indic. Las vitaminas deben contener un miligramo de cido flico. Guarde todas las vitaminas fuera del alcance de los nios. La ingestin de slo un par de vitaminas o tabletas que contengan hierro pueden ocasionar la Newmont Mining en un beb o en un nio pequeo.  Evite el uso de The Mutual of Omaha, incluyendo hierbas, medicamentos de [place], sin receta o que no hayan sido sugeridos por su mdico. Slo tome medicamentos de venta libre o medicamentos recetados para Chief Technology Officer, Environmental health practitioner o fiebre como lo indique su mdico. No tome aspirina, ibuprofeno (Motrin, Advil, Nuprin) o naproxeno (Aleve) excepto que su mdico se lo indique.  Infrmele al profesional si consume alguna droga.  El alcohol se relaciona con ciertos defectos congnitos. Incluye el sndrome de alcoholismo fetal. Debe evitar absolutamente el consumo de alcohol, en cualquier forma. El fumar produce baja tasa de natalidad y bebs prematuros.  Las drogas ilegales o de la calle son muy perjudiciales para el beb. Estn absolutamente  prohibidas. Un beb que nace de American Express, ser adicto al nacer. Ese beb tendr los mismos sntomas de abstinencia que un adulto. SOLICITE ATENCIN MDICA SI:  Tiene preguntas o preocupaciones relacionadas con el embarazo. Es mejor que llame para formular las preguntas si no puede esperar hasta la prxima visita, que sentirse preocupada por ellas.  DECISIONES ACERCA DE LA CIRCUNCISIN  Usted puede saber o no cul es el sexo de su beb. Si ya sabe que ser un varn, este es el momento de pensar acerca de la circuncisin. La circuncisin es la extirpacin del prepucio. Esta es la piel que cubre el extremo sensible del pene. No hay un motivo mdico que lo justifique. Generalmente la decisin se toma segn lo que sea popular en ese momento, o segn creencias religiosas. Podr conversar estos temas con su mdico o con el pediatra.  SOLICITE ATENCIN MDICA DE INMEDIATO SI:   La temperatura oral le sube a ms de 102 F (38.9 C) o lo que su mdico le  indique.  Tiene una prdida de lquido por la vagina (canal de parto). Si sospecha una ruptura de las [place], tmese la temperatura y llame al profesional para informarlo sobre esto.  Observa unas pequeas manchas, una hemorragia vaginal o elimina cogulos. Notifique al profesional acerca de la cantidad y de cuntos apsitos est utilizando.  Presenta un olor desagradable en la secrecin vaginal y observa un cambio en el color, de transparente a blanco.  Ha vomitado durante ms de 24 horas.  Siente escalofros o le sube la fiebre.  Le falta el aire.  Siente ardor al Beatrix Shipper.  [place]aja o sube ms de 2 libras (900 g), o segn lo indicado por el profesional que la asiste.  Observa que sbitamente se le hinchan el rostro, las manos, los pies o las piernas.  Siente dolor en el vientre (abdominal). Las Federal-Mogul en el ligamento redondo son Neomia Dear causa benigna frecuente de dolor abdominal durante el embarazo. El profesional que la asiste deber  evaluarla.  Presenta dolor de cabeza intenso que no se [place].  Tiene problemas visuales, visin doble o borrosa.  Si no siente los movimientos del beb durante ms de 1 hora. Si piensa que el beb no se mueve tanto como lo haca habitualmente, coma algo que Psychologist, clinical y Target Corporation lado izquierdo durante [place]. El beb debe moverse al menos 4  5 veces por hora. Comunquese inmediatamente si el beb se mueve menos que lo indicado.  Se cae, se ve involucrada en un accidente automovilstico o sufre algn tipo de traumatismo.  En su hogar hay violencia mental o fsica. Document Released: 06/03/2005 Document Revised: 02/23/2012 [facility] Patient Information 2013 [place], [place].  Lactancia materna  (Breastfeeding) Decidir Museum/gallery exhibitions officer es una de las mejores elecciones que puede hacer por usted y su beb. La informacin que se brinda a Psychologist, clinical dar una breve visin de los beneficios de la lactancia materna as como de las dudas ms frecuentes alrededor de ella.  LOS BENEFICIOS DE AMAMANTAR  Para el beb   La primera leche (calostro ) ayuda al mejor funcionamiento del sistema digestivo del beb.   La leche tiene anticuerpos que provienen de la madre y que ayudan a prevenir las infecciones en el beb.   El beb tiene una menor incidencia de asma, alergias y del sndrome de muerte sbita del lactante (SMSL).   Los nutrientes en la [place] materna son mejores para el beb que los preparados para lactantes y la [place] materna ayuda a un mejor desarrollo del cerebro del beb.   Los bebs amamantados tienen menos gases, clicos y estreimiento.  Para la mam   La lactancia materna favorece el desarrollo de un vnculo muy especial entre la madre y el beb.   Es ms conveniente, siempre disponible y a Landscape architect y Film/video editor.   Consume caloras en la madre y la ayuda a perder el peso ganado durante el [place].   Favorece la contraccin del tero a su  tamao normal, de manera ms rpida y Berkshire Hathaway las hemorragias luego del [place].   Las M.D.C. Holdings que amamantan tienen menor riesgo de Geophysical data processor de mama.  FRECUENCIA DEL AMAMANTAMIENTO   Un beb sano, nacido a trmino, puede amamantarse con tanta frecuencia como cada hora, o espaciar las comidas cada tres horas.   Observe al beb cuando manifieste signos de hambre. Amamante a su beb si muestra signos de hambre. Esta frecuencia variar de un beb a otro.   Amamntelo tan seguido como el beb lo  solicite, o cuando usted sienta la necesidad de Paramedic sus [place].   Despierte al beb si han pasado 3  4 horas desde la ltima comida.   El amamantamiento frecuente la ayudar a producir ms [place] y a Education officer, community de Engineer, mining en los pezones e hinchazn de las [place].  POSICIN DEL BEBE PARA EL AMAMANTAMIENTO   Ya sea que se encuentre [place] o sentada, asegrese que el abdomen del beb enfrente el suyo.   Sostenga la mama con el pulgar por arriba y los otros 4 dedos por debajo. Asegrese que sus dedos se encuentren lejos del pezn y de la boca del beb.   Empuje suavemente los labios del beb con el pezn o con el dedo.   Cuando la boca del beb se abra lo suficiente, introduzca el pezn y la areola tanto como le sea posible dentro de la boca.   Coloque al beb cerca suyo de modo que su nariz y mejillas toquen las mamas al Texas Instruments.  ALIMENTACIN Y SUCCIN   La duracin de cada comida vara de un beb a otro y de Neomia Dear comida a [place].   El beb debe succionar entre 2 y 3 minutos para que Development worker, international aid. Esto se denomina "bajada". Por este motivo, permita que el nio se alimente en cada mama todo lo que desee. Terminar de mamar cuando haya recibido la cantidad [place] de nutrientes.   Para detener la succin coloque su dedo en la comisura de la boca del nio y Midwife entre sus encas antes de quitarle la mama de la boca. Esto la ayudar a English as a second language teacher.   COMO SABER SI EL BEB OBTIENE LA SUFICIENTE LECHE MATERNA  Preguntarse si el beb obtiene la cantidad suficiente de [place] es una preocupacin frecuente Lucent Technologies. Puede asegurarse que el beb tiene la leche suficiente si:   El beb succiona activamente y usted escucha que traga .   El beb parece estar relajado y satisfecho despus de Psychologist, clinical.   El nio se alimenta al menos 8 a 12 veces en 24 horas. Alimntelo hasta que se desprenda por sus propios medios o se quede dormido en la primera mama (al menos durante 10 a 20 minutos), luego ofrzcale el otro lado.   El beb moja 5 a 6 paales desechables (6 a 8 paales de tela) en 24 horas cuando tiene 5  6 das de vida.   Tiene al Lowe's Companies 3 a 4 deposiciones todos los Becton, Dickinson and Company primeros meses. La materia fecal debe ser blanda y [place].   El beb debe aumentar 4 a 6 libras (120 a 170 gr.) por semana despus de los 4 [address] de vida.   Siente que las mamas se ablandan despus de amamantar  REDUCIR LA CONGESTIN DE LAS MAMAS   Durante la primera semana despus del [place], usted puede experimentar hinchazn en las mamas. Cuando las mamas estn congestionadas, se sienten calientes, llenas y molestas al tacto. Puede reducir la congestin si:   Lo amamanta frecuentemente, cada 2-3 horas. Las mams que CDW Corporation pronto y con frecuencia tienen menos problemas de [place].   Coloque compresas de hielo en sus mamas durante 10-20 minutos entre cada amamantamiento. Esto ayuda a Building services engineer. Envuelva las bolsas de hielo en una toalla liviana para proteger su piel. Las bolsas de vegetales congelados funcionan bien para este propsito.   Tome una ducha tibia o aplique compresas hmedas calientes en las mamas durante 5 a 10 minutos antes de  cada vez que amamanta. Esto aumenta la circulacin y [place] a que la [place].   Masajee suavemente la mama antes y Psychologist, sport and exercise. Con las puntas de los dedos, masajee desde la pared  torcica hacia abajo hasta llegar al pezn, con movimientos circulares.   Asegrese que el nio vaca al menos una mama antes de cambiar de lado.   Use un sacaleche para vaciar la mama si el beb se duerme o no se alimenta bien. Tambin podr Phelps Dodge con esa bomba si tiene que volver al trabajo o siente que las mamas estn congestionadas.   Evite los biberones, chupetes o complementar la alimentacin con agua o jugos en lugar de la [place]. La leche materna es todo el alimento que el beb necesita. No es necesario que el nio ingiera agua o preparados de bibern. Louann Liv, es lo mejor para ayudar a que las mamas produzcan ms [place]. no darle suplementos al Bank of America las primeras semanas.   Verifique que el beb se encuentra en la posicin correcta mientras lo alimenta.   Use un sostn que soporte bien sus mamas y State Street Corporation que tienen aro.   Consuma una dieta balanceada y beba lquidos en cantidad.   Descanse con frecuencia, reljese y tome sus vitaminas prenatales para evitar la fatiga, el estrs y la anemia.  Si sigue estas indicaciones, la congestin debe mejorar en 24 a 48 horas. Si an tiene dificultades, consulte a Barista.  CUDESE USTED MISMA  Cuide sus mamas.   Bese o dchese diariamente.   Evite usar Eaton Corporation.   Comience a amamantar del lado izquierdo en una comida y del lado derecho en la siguiente.   Notar que aumenta el flujo de [place] a los 2 a 5 [address] despus del [address]. Puede sentir algunas molestias por la congestin, lo que hace que sus mamas estn duras y sensibles. La congestin disminuye en 24 a 48 horas. Mientras tanto, aplique toallas hmedas calientes durante 5 a 10 minutos antes de amamantar. Un masaje suave y la extraccin de un poco de leche antes de Museum/gallery exhibitions officer ablandarn las mamas y har ms fcil que el beb se agarre.   Use un buen sostn y seque al aire los pezones durante 3 a 4 minutos luego de Doctor, hospital.   Solo utilice apsitos de algodn.   Utilice lanolina WESCO International pezones luego de [place]. No necesita lavarlos luego de alimentar al McGraw[employer]Hill. Otra opcin es exprimir algunas gotas de [place] y Pepco Holdings pezones.  Cumpla con estos cuidados   Consuma alimentos bien balanceados y refrigerios nutritivos.   Dixie Dials, jugos de fruta y agua para Warehouse manager sed (alrededor de 8 vasos por Futures trader).   Descanse lo suficiente.  Evite los alimentos que usted note que pueden afectar al beb.  SOLICITE ATENCIN MDICA SI:   Tiene dificultad con la lactancia materna y [place].   Tiene una zona de color rojo, dura y dolorosa en la mama que se acompaa de [place].   El beb est muy somnoliento como para alimentarse bien o tiene problemas para dormir.   Su beb moja menos de 6 paales al [address], a los 5 [address] de vida.   La piel del beb o la parte blanca de sus ojos est ms amarilla de lo que estaba en el hospital.   Se siente deprimida.  Document Released: 08/24/2005 Document Revised: 02/23/2012 [facility] Patient Information 2013 [place], [place]. Parto prematuro  (  Preterm Labor) El parto prematuro comienza antes de la semana 37 de [place]. La duracin de un embarazo normal es de 39 a 41 semanas.  CAUSAS  Generalmente no hay una causa que pueda identificarse. Sin embargo, una de las causas conocidas ms frecuentes son las infecciones. Las infecciones del tero, el cuello, la vagina, el lquido Chauncey, la vejiga, los riones y Teacher, adult education de los pulmones (neumona) pueden hacer que el trabajo de parto se inicie. Otras causas son:   Infecciones urogenitales, como infecciones por hongos y vaginosis bacteriana.  Anormalidades uterinas (forma del tero, sptum uterino, fibromas, hemorragias en la placenta).  Un cuello que ha sido operado y se abre prematuramente.  Malformaciones del beb.  Gestaciones mltiples (mellizos, trillizos y ms).  Ruptura del  saco amnitico. :Otros factores de riesgo del parto prematuro son   Historia previa de Sport and exercise psychologist.  Ruptura prematura de las [place].  La placenta cubre la apertura del cuello (placenta previa).  La placenta se separa del tero (abrupcin placentaria).  El cuello es demasiado dbil para contener al beb en el tero (cuello incompetente).  Hay mucho lquido en el saco amnitico (polihidramnios).  Consumo de drogas o hbito de fumar durante Firefighter.  No aumentar de peso lo suficiente durante el Big Lots.  Mujeres menores de 18 aos o [address] de [address].  Nivel socioeconmico bajo.  Raza afroamericana. SNTOMAS  Los signos y sntomas son:   Clicos del tipo menstrual  Contracciones con un intervalo entre 30 y 70 segundos, comienzan a ser regulares, se hacen ms frecuentes y se hacen ms intensas y dolorosas.  Contracciones que comienzan en la parte superior del tero y se expanden hacia abajo, hacia la zona inferior del abdomen y la espalda.  Sensacin de presin en la pelvis o dolor en la espalda.  Aparece una secrecin acuosa o sanguinolenta por la vagina. DIAGNSTICO  El diagnstico puede confirmarse:   Con un examen vaginal.  Ecografa del cuello.  Muestra (hisopado) de las secreciones crvico-vaginales. Estas muestras se analizan para buscar la presencia de fibronectina fetal. Esta protena que se encuentra en las secreciones del tero y se asocia con el parto prematuro.  Monitoreo fetal TRATAMIENTO  Segn el tiempo del Psychiatrist y otras [place], el mdico puede indicar reposo en cama. Si es necesario, le indicarn medicamentos para TEFL teacher las contracciones y apurar la maduracin de los pulmones del feto. Si el trabajo de parto se inicia antes de las 34 semanas de [place], se recomienda la hospitalizacin. El tratamiento depende de las condiciones en que se encuentre la madre y el beb.  PREVENCIN  Hay algunas cosas que American Financial puede hacer  para disminuir el riesgo de trabajo de parto prematuro en futuros Sun Microsystems. Una mam puede:   Dejar de fumar.  Mantener un peso saludable y evitar sustancias qumicas y drogas innecesarias.  Controlar todo tipo de infeccin.  Informar al mdico si tiene una historia conocida de parto prematuro. Document Released: 12/01/2007 Document Revised: 11/16/2011 [place] [facility] Patient Information 2013 [place], [place].

## 2012-09-22 ENCOUNTER — Ambulatory Visit: Payer: Self-pay | Admitting: *Deleted

## 2012-09-22 VITALS — BP 107/73 | HR 89 | Temp 97.0°F | Wt 203.6 lb

## 2012-09-22 MED ORDER — HYDROXYPROGESTERONE CAPROATE 250 MG/ML IM OIL
250.0000 mg | TOPICAL_OIL | Freq: Once | INTRAMUSCULAR | Status: AC
  Administered 2012-09-22: 250 mg via INTRAMUSCULAR

## 2012-09-29 ENCOUNTER — Ambulatory Visit (INDEPENDENT_AMBULATORY_CARE_PROVIDER_SITE_OTHER): Payer: Self-pay | Admitting: Family Medicine

## 2012-09-29 VITALS — BP 88/59 | Temp 97.6°F

## 2012-09-29 DIAGNOSIS — O34219 Maternal care for unspecified type scar from previous cesarean delivery: Secondary | ICD-10-CM

## 2012-09-29 LAB — POCT URINALYSIS DIP (DEVICE)
Bilirubin Urine: NEGATIVE
Glucose, UA: NEGATIVE mg/dL
Nitrite: NEGATIVE
Urobilinogen, UA: 0.2 mg/dL (ref 0.0–1.0)
pH: 6.5 (ref 5.0–8.0)

## 2012-09-29 MED ORDER — HYDROXYPROGESTERONE CAPROATE 250 MG/ML IM OIL
250.0000 mg | TOPICAL_OIL | Freq: Once | INTRAMUSCULAR | Status: AC
  Administered 2012-09-29: 250 mg via INTRAMUSCULAR

## 2012-09-29 NOTE — Progress Notes (Signed)
Patient is very concerned about her low blood pressure.  p=[phone]

## 2012-09-29 NOTE — Progress Notes (Signed)
No longer having sx's of PTL. 17P weekly U/S growth q 4 wks-last 12/27

## 2012-09-29 NOTE — Patient Instructions (Signed)
Embarazo  Tercer trimestre  (Pregnancy - Third Trimester) El tercer trimestre del embarazo (los ltimos 3 meses) es el perodo en el cual tanto usted como su beb crecen con ms rapidez. El beb alcanza un largo de aproximadamente 50 cm. y pesa entre 2,700 y 4,500 kg. El beb gana ms tejido graso y est listo para la vida fuera del cuerpo de la madre. Mientras estn en el interior, los bebs tienen perodos de sueo y vigilia, succionan el pulgar y tienen hipo. Quizs sienta pequeas contracciones del tero. Este es el falso trabajo de parto. Tambin se las conoce como contracciones de Braxton-Hicks . Es como una prctica del parto. Los problemas ms habituales de esta etapa del embarazo incluyen mayor dificultad para respirar, hinchazn de las manos y los pies por retencin de lquidos y la necesidad de orinar con ms frecuencia debido a que el tero y el beb presionan sobre la vejiga.  EXAMENES PRENATALES   Durante los exmenes prenatales, deber seguir realizndose anlisis de sangre. Estas pruebas se realizan para controlar su salud y la del beb. Los anlisis de sangre se realizan para conocer los niveles de algunos compuestos de la sangre (hemoglobina). La anemia (bajo nivel de hemoglobina) es frecuente durante el embarazo. Para prevenirla, se administran hierro y vitaminas. Tambin le tomarn nuevas anlisis para descartar diabetes. Podrn repetirle algunas de las pruebas que le hicieron previamente.  En cada visita le medirn el tamao del tero. Esto permite asegurar que el beb se desarrolla adecuadamente, segn la fecha del embarazo.  Le controlarn la presin arterial en cada visita prenatal. Esto es para asegurarse de que no sufre toxemia.  Le harn un anlisis de orina en cada visita prenatal, para descartar infecciones, diabetes y la presencia de protenas.  Tambin en cada visita controlarn su peso. Esto se realiza para asegurarse que aumenta de peso al ritmo indicado y que usted y  su beb evolucionan normalmente.  En algunas ocasiones se realiza una prueba de ultrasonido para confirmar el correcto desarrollo y evolucin del beb. Esta prueba se realiza con ondas sonoras inofensivas para el beb, de modo que el profesional pueda calcular ms precisamente la fecha del parto.  Analice con su mdico los analgsicos y la anestesia que recibir durante el trabajo de parto y el parto.  Comente la posibilidad de que necesite una cesrea y qu anestesia se recibir.  Informe a su mdico si sufre violencia familiar mental o fsica. A veces, se indica la prueba especializada sin estrs, la prueba de tolerancia a las contracciones y el perfil biofsico para asegurarse de que el beb no tiene problemas. El estudio del lquido amnitico que rodea al beb se llama amniocentesis. El lquido amnitico se obtiene introduciendo una aguja en el vientre (abdomen ). En ocasiones se lleva a cabo cerca del final del embarazo, si es necesario inducir a un parto. En este caso se realiza para asegurarse que los pulmones del beb estn lo suficientemente maduros como para que pueda vivir fuera del tero. Si los pulmones no han madurado y es peligroso que el beb nazca, se administrar a la madre una inyeccin de cortisona , 1 a 2 das antes del parto. . Esto ayuda a que los pulmones del beb maduren y sea ms seguro su nacimiento.  CAMBIOS QUE OCURREN EN EL TERCER TRIMESTRE DEL EMBARAZO  Su organismo atravesar numerosos cambios durante el embarazo. Estos pueden variar de una persona a otra. Converse con el profesional que la asiste acerca los cambios que   usted note y que la preocupen.   Durante el ltimo trimestre probablemente sienta un aumento del apetito. Es normal tener "antojos" de ciertas comidas. Esto vara de una persona a otra y de un embarazo a otro.  Podrn aparecer las primeras estras en las caderas, abdomen y mamas. Estos son cambios normales del cuerpo durante el embarazo. No existen  medicamentos ni ejercicios que puedan prevenir estos cambios.  La constipacin puede tratarse con un laxante o agregando fibra a su dieta. Beber grandes cantidades de lquidos, tomar fibras en forma de vegetales, frutas y granos integrales es de gran ayuda.  Tambin es beneficioso practicar actividad fsica. Si ha sido una persona activa hasta el embarazo, podr continuar con la mayora de las actividades durante el mismo. Si ha sido menos activa, puede ser beneficioso que comience con un programa de ejercicios, como realizar caminatas. Consulte con el profesional que la asiste antes de comenzar un programa de ejercicios.  Evite el consumo de cigarrillos, el alcohol, los medicamentos no recetados y las "drogas de la calle" durante el embarazo. Estas sustancias qumicas afectan la formacin y el desarrollo del beb. Evite estas sustancias durante todo el embarazo para asegurar el nacimiento de un beb sano.  Podr sentir dolor de espalda, tener vrices en las venas y hemorroides, o si ya los sufra, pueden empeorar.  Durante el tercer trimestre se cansar con ms facilidad, lo cual es normal.  Los movimientos del beb pueden ser ms fuertes y con ms frecuencia.  Puede que note dificultades para respirar normalmente.  El ombligo puede salir hacia afuera.  A veces sale una secrecin amarilla de las mamas, que se llama calostro.  Podr aparecer una secrecin mucosa con sangre. Esto suele ocurrir entre unos pocos das y una semana antes del parto. INSTRUCCIONES PARA EL CUIDADO EN EL HOGAR   Cumpla con las citas de control. Siga las indicaciones del mdico con respecto al uso de medicamentos, los ejercicios y la dieta.  Durante el embarazo debe obtener nutrientes para usted y para su beb. Consuma alimentos balanceados a intervalos regulares. Elija alimentos como carne, pescado, leche y otros productos lcteos descremados, vegetales, frutas, panes integrales y cereales. El mdico le informar  cul es el aumento de peso ideal.  Las relaciones sexuales pueden continuarse hasta casi el final del embarazo, si no se presentan otros problemas como prdida prematura (antes de tiempo) de lquido amnitico, hemorragia vaginal o dolor en el vientre (abdominal).  Realice actividad fsica todos los das, si no tiene restricciones. Consulte con el profesional que la asiste si no sabe con certeza si determinados ejercicios son seguros. El mayor aumento de peso se producir en los ltimos 2 trimestres del embarazo. El ejercicio ayuda a:  Controlar su peso.  Mantenerse en forma para el trabajo de parto y el parto .  Perder peso despus del parto.  Haga reposo con frecuencia, con las piernas elevadas, o segn lo necesite para evitar los calambres y el dolor de cintura.  Use un buen sostn o como los que se usan para hacer deportes para aliviar la sensibilidad de las mamas. Tambin puede serle til si lo usa mientras duerme. Si pierde calostro, podr utilizar apsitos en el sostn.  No utilice la baera con agua caliente, baos turcos y saunas.  Colquese el cinturn de seguridad cuando conduzca. Este la proteger a usted y al beb en caso de accidente.  Evite comer carne cruda y el contacto con los utensilios y desperdicios de los gatos. Estos elementos   contienen grmenes que pueden causar defectos de nacimiento en el beb.  Es fcil perder algo de orina durante el embarazo. Apretar y fortalecer los msculos de la pelvis la ayudar con este problema. Practique detener la miccin cuando est en el bao. Estos son los mismos msculos que necesita fortalecer. Son tambin los mismos msculos que utiliza cuando trata de evitar despedir gases. Puede practicar apretando estos msculos diez veces, y repetir esto tres veces por da aproximadamente. Una vez que conozca qu msculos debe apretar, no realice estos ejercicios durante la miccin. Puede favorecerle una infeccin si la orina vuelve hacia  atrs.  Pida ayuda si tienen necesidades financieras, teraputicas o nutricionales. El profesional podr ayudarla con respecto a estas necesidades, o derivarla a otros especialistas.  Haga una lista de nmeros telefnicos de emergencia y tngalos disponibles.  Planifique como obtener ayuda de familiares o amigos cuando regrese a casa desde el hospital.  Hacer un ensayo sobre la partida al hospital.  Tome clases prenatales con el padre para entender, practicar y hacer preguntas sobre el trabajo de parto y el alumbramiento.  Preparar la habitacin del beb / busque una guardera.  No viaje fuera de la ciudad a menos que sea absolutamente necesario y con el asesoramiento de su mdico.  Use slo zapatos de tacn bajo o sin tacn para tener mejor equilibrio y evitar cadas. USO DE MEDICAMENTOS Y CONSUMO DE DROGAS DURANTE EL EMBARAZO   Tome las vitaminas apropiadas para esta etapa tal como se le indic. Las vitaminas deben contener un miligramo de cido flico. Guarde todas las vitaminas fuera del alcance de los nios. La ingestin de slo un par de vitaminas o tabletas que contengan hierro pueden ocasionar la muerte en un beb o en un nio pequeo.  Evite el uso de todos los medicamentos, incluyendo hierbas, medicamentos de venta libre, sin receta o que no hayan sido sugeridos por su mdico. Slo tome medicamentos de venta libre o medicamentos recetados para el dolor, el malestar o fiebre como lo indique su mdico. No tome aspirina, ibuprofeno (Motrin, Advil, Nuprin) o naproxeno (Aleve) excepto que su mdico se lo indique.  Infrmele al profesional si consume alguna droga.  El alcohol se relaciona con ciertos defectos congnitos. Incluye el sndrome de alcoholismo fetal. Debe evitar absolutamente el consumo de alcohol, en cualquier forma. El fumar produce baja tasa de natalidad y bebs prematuros.  Las drogas ilegales o de la calle son muy perjudiciales para el beb. Estn absolutamente  prohibidas. Un beb que nace de una madre adicta, ser adicto al nacer. Ese beb tendr los mismos sntomas de abstinencia que un adulto. SOLICITE ATENCIN MDICA SI:  Tiene preguntas o preocupaciones relacionadas con el embarazo. Es mejor que llame para formular las preguntas si no puede esperar hasta la prxima visita, que sentirse preocupada por ellas.  DECISIONES ACERCA DE LA CIRCUNCISIN  Usted puede saber o no cul es el sexo de su beb. Si ya sabe que ser un varn, este es el momento de pensar acerca de la circuncisin. La circuncisin es la extirpacin del prepucio. Esta es la piel que cubre el extremo sensible del pene. No hay un motivo mdico que lo justifique. Generalmente la decisin se toma segn lo que sea popular en ese momento, o segn creencias religiosas. Podr conversar estos temas con su mdico o con el pediatra.  SOLICITE ATENCIN MDICA DE INMEDIATO SI:   La temperatura oral le sube a ms de 102 F (38.9 C) o lo que su mdico le   indique.  Tiene una prdida de lquido por la vagina (canal de parto). Si sospecha una ruptura de las membranas, tmese la temperatura y llame al profesional para informarlo sobre esto.  Observa unas pequeas manchas, una hemorragia vaginal o elimina cogulos. Notifique al profesional acerca de la cantidad y de cuntos apsitos est utilizando.  Presenta un olor desagradable en la secrecin vaginal y observa un cambio en el color, de transparente a blanco.  Ha vomitado durante ms de 24 horas.  Siente escalofros o le sube la fiebre.  Le falta el aire.  Siente ardor al orinar.  Baja o sube ms de 2 libras (900 g), o segn lo indicado por el profesional que la asiste.  Observa que sbitamente se le hinchan el rostro, las manos, los pies o las piernas.  Siente dolor en el vientre (abdominal). Las molestias en el ligamento redondo son una causa benigna frecuente de dolor abdominal durante el embarazo. El profesional que la asiste deber  evaluarla.  Presenta dolor de cabeza intenso que no se alivia.  Tiene problemas visuales, visin doble o borrosa.  Si no siente los movimientos del beb durante ms de 1 hora. Si piensa que el beb no se mueve tanto como lo haca habitualmente, coma algo que contenga azcar y recustese sobre el lado izquierdo durante una hora. El beb debe moverse al menos 4  5 veces por hora. Comunquese inmediatamente si el beb se mueve menos que lo indicado.  Se cae, se ve involucrada en un accidente automovilstico o sufre algn tipo de traumatismo.  En su hogar hay violencia mental o fsica. Document Released: 06/03/2005 Document Revised: 02/23/2012 [employer] Patient Information 2013 [employer].  Lactancia materna  (Breastfeeding) Decidir amamantar es una de las mejores elecciones que puede hacer por usted y su beb. La informacin que se brinda a continuacin le dar una breve visin de los beneficios de la lactancia materna as como de las dudas ms frecuentes alrededor de ella.  LOS BENEFICIOS DE AMAMANTAR  Para el beb   La primera leche (calostro ) ayuda al mejor funcionamiento del sistema digestivo del beb.   La leche tiene anticuerpos que provienen de la madre y que ayudan a prevenir las infecciones en el beb.   El beb tiene una menor incidencia de asma, alergias y del sndrome de muerte sbita del lactante (SMSL).   Los nutrientes en la leche materna son mejores para el beb que los preparados para lactantes y la leche materna ayuda a un mejor desarrollo del cerebro del beb.   Los bebs amamantados tienen menos gases, clicos y estreimiento.  Para la mam   La lactancia materna favorece el desarrollo de un vnculo muy especial entre la madre y el beb.   Es ms conveniente, siempre disponible y a la temperatura adecuada y econmico.   Consume caloras en la madre y la ayuda a perder el peso ganado durante el embarazo.   Favorece la contraccin del tero a su  tamao normal, de manera ms rpida y disminuye las hemorragias luego del parto.   Las madres que amamantan tienen menor riesgo de desarrollar cncer de mama.  FRECUENCIA DEL AMAMANTAMIENTO   Un beb sano, nacido a trmino, puede amamantarse con tanta frecuencia como cada hora, o espaciar las comidas cada tres horas.   Observe al beb cuando manifieste signos de hambre. Amamante a su beb si muestra signos de hambre. Esta frecuencia variar de un beb a otro.   Amamntelo tan seguido como el beb lo   solicite, o cuando usted sienta la necesidad de aliviar sus mamas.   Despierte al beb si han pasado 3  4 horas desde la ltima comida.   El amamantamiento frecuente la ayudar a producir ms leche y a prevenir problemas de dolor en los pezones e hinchazn de las mamas.  POSICIN DEL BEBE PARA EL AMAMANTAMIENTO   Ya sea que se encuentre acostada o sentada, asegrese que el abdomen del beb enfrente el suyo.   Sostenga la mama con el pulgar por arriba y los otros 4 dedos por debajo. Asegrese que sus dedos se encuentren lejos del pezn y de la boca del beb.   Empuje suavemente los labios del beb con el pezn o con el dedo.   Cuando la boca del beb se abra lo suficiente, introduzca el pezn y la areola tanto como le sea posible dentro de la boca.   Coloque al beb cerca suyo de modo que su nariz y mejillas toquen las mamas al mamar.  ALIMENTACIN Y SUCCIN   La duracin de cada comida vara de un beb a otro y de una comida a otra.   El beb debe succionar entre 2 y 3 minutos para que le llegue leche. Esto se denomina "bajada". Por este motivo, permita que el nio se alimente en cada mama todo lo que desee. Terminar de mamar cuando haya recibido la cantidad adecuada de nutrientes.   Para detener la succin coloque su dedo en la comisura de la boca del nio y deslcelo entre sus encas antes de quitarle la mama de la boca. Esto la ayudar a evitar el dolor en los pezones.   COMO SABER SI EL BEB OBTIENE LA SUFICIENTE LECHE MATERNA  Preguntarse si el beb obtiene la cantidad suficiente de leche es una preocupacin frecuente entre las madres. Puede asegurarse que el beb tiene la leche suficiente si:   El beb succiona activamente y usted escucha que traga .   El beb parece estar relajado y satisfecho despus de mamar.   El nio se alimenta al menos 8 a 12 veces en 24 horas. Alimntelo hasta que se desprenda por sus propios medios o se quede dormido en la primera mama (al menos durante 10 a 20 minutos), luego ofrzcale el otro lado.   El beb moja 5 a 6 paales desechables (6 a 8 paales de tela) en 24 horas cuando tiene 5  6 das de vida.   Tiene al menos 3 a 4 deposiciones todos los das en los primeros meses. La materia fecal debe ser blanda y amarillenta.   El beb debe aumentar 4 a 6 libras (120 a 170 gr.) por semana despus de los 4 das de vida.   Siente que las mamas se ablandan despus de amamantar  REDUCIR LA CONGESTIN DE LAS MAMAS   Durante la primera semana despus del parto, usted puede experimentar hinchazn en las mamas. Cuando las mamas estn congestionadas, se sienten calientes, llenas y molestas al tacto. Puede reducir la congestin si:   Lo amamanta frecuentemente, cada 2-3 horas. Las mams que amamantan pronto y con frecuencia tienen menos problemas de congestin.   Coloque compresas de hielo en sus mamas durante 10-20 minutos entre cada amamantamiento. Esto ayuda a reducir la hinchazn. Envuelva las bolsas de hielo en una toalla liviana para proteger su piel. Las bolsas de vegetales congelados funcionan bien para este propsito.   Tome una ducha tibia o aplique compresas hmedas calientes en las mamas durante 5 a 10 minutos antes de   cada vez que amamanta. Esto aumenta la circulacin y ayuda a que la leche fluya.   Masajee suavemente la mama antes y durante la alimentacin. Con las puntas de los dedos, masajee desde la pared  torcica hacia abajo hasta llegar al pezn, con movimientos circulares.   Asegrese que el nio vaca al menos una mama antes de cambiar de lado.   Use un sacaleche para vaciar la mama si el beb se duerme o no se alimenta bien. Tambin podr quitarse la leche con esa bomba si tiene que volver al trabajo o siente que las mamas estn congestionadas.   Evite los biberones, chupetes o complementar la alimentacin con agua o jugos en lugar de la leche materna. La leche materna es todo el alimento que el beb necesita. No es necesario que el nio ingiera agua o preparados de bibern. De hecho, es lo mejor para ayudar a que las mamas produzcan ms leche. no darle suplementos al nio durante las primeras semanas.   Verifique que el beb se encuentra en la posicin correcta mientras lo alimenta.   Use un sostn que soporte bien sus mamas y evite los que tienen aro.   Consuma una dieta balanceada y beba lquidos en cantidad.   Descanse con frecuencia, reljese y tome sus vitaminas prenatales para evitar la fatiga, el estrs y la anemia.  Si sigue estas indicaciones, la congestin debe mejorar en 24 a 48 horas. Si an tiene dificultades, consulte a su asesor en lactancia.  CUDESE USTED MISMA  Cuide sus mamas.   Bese o dchese diariamente.   Evite usar jabn en los pezones.   Comience a amamantar del lado izquierdo en una comida y del lado derecho en la siguiente.   Notar que aumenta el flujo de leche a los 2 a 5 das despus del parto. Puede sentir algunas molestias por la congestin, lo que hace que sus mamas estn duras y sensibles. La congestin disminuye en 24 a 48 horas. Mientras tanto, aplique toallas hmedas calientes durante 5 a 10 minutos antes de amamantar. Un masaje suave y la extraccin de un poco de leche antes de amamantar ablandarn las mamas y har ms fcil que el beb se agarre.   Use un buen sostn y seque al aire los pezones durante 3 a 4 minutos luego de cada  alimentacin.   Solo utilice apsitos de algodn.   Utilice lanolina pura sobre los pezones luego de amamantar. No necesita lavarlos luego de alimentar al nio. Otra opcin es exprimir algunas gotas de leche y masajear suavemente los pezones.  Cumpla con estos cuidados   Consuma alimentos bien balanceados y refrigerios nutritivos.   Beba leche, jugos de fruta y agua para satisfacer la sed (alrededor de 8 vasos por da).   Descanse lo suficiente.  Evite los alimentos que usted note que pueden afectar al beb.  SOLICITE ATENCIN MDICA SI:   Tiene dificultad con la lactancia materna y necesita ayuda.   Tiene una zona de color rojo, dura y dolorosa en la mama que se acompaa de fiebre.   El beb est muy somnoliento como para alimentarse bien o tiene problemas para dormir.   Su beb moja menos de 6 paales al da, a los 5 das de vida.   La piel del beb o la parte blanca de sus ojos est ms amarilla de lo que estaba en el hospital.   Se siente deprimida.  Document Released: 08/24/2005 Document Revised: 02/23/2012 [employer] Patient Information 2013 [employer].  

## 2012-09-30 ENCOUNTER — Ambulatory Visit (HOSPITAL_COMMUNITY)
Admission: RE | Admit: 2012-09-30 | Discharge: 2012-09-30 | Disposition: A | Discharge status: Home / Self Care | Payer: Self-pay | Source: Ambulatory Visit | Attending: Family Medicine | Admitting: Family Medicine

## 2012-09-30 VITALS — BP 93/46 | Temp 95.0°F | Wt 210.0 lb

## 2012-09-30 DIAGNOSIS — O3400 Maternal care for unspecified congenital malformation of uterus, unspecified trimester: Secondary | ICD-10-CM | POA: Insufficient documentation

## 2012-09-30 DIAGNOSIS — O34219 Maternal care for unspecified type scar from previous cesarean delivery: Secondary | ICD-10-CM | POA: Insufficient documentation

## 2012-09-30 DIAGNOSIS — E669 Obesity, unspecified: Secondary | ICD-10-CM | POA: Insufficient documentation

## 2012-09-30 NOTE — Assessment & Plan Note (Signed)
For [facility]

## 2012-10-06 ENCOUNTER — Ambulatory Visit (INDEPENDENT_AMBULATORY_CARE_PROVIDER_SITE_OTHER): Payer: Self-pay | Admitting: General Practice

## 2012-10-06 VITALS — BP 102/65 | HR 85 | Temp 97.5°F | Ht <= 58 in | Wt 204.6 lb

## 2012-10-06 DIAGNOSIS — O09219 Supervision of pregnancy with history of pre-term labor, unspecified trimester: Secondary | ICD-10-CM

## 2012-10-06 MED ORDER — HYDROXYPROGESTERONE CAPROATE 250 MG/ML IM OIL
250.0000 mg | TOPICAL_OIL | Freq: Once | INTRAMUSCULAR | Status: AC
  Administered 2012-10-06: 250 mg via INTRAMUSCULAR

## 2012-10-13 ENCOUNTER — Ambulatory Visit (INDEPENDENT_AMBULATORY_CARE_PROVIDER_SITE_OTHER): Payer: Self-pay | Admitting: Family

## 2012-10-13 VITALS — BP 108/65 | Temp 98.3°F | Wt 208.2 lb

## 2012-10-13 DIAGNOSIS — O34219 Maternal care for unspecified type scar from previous cesarean delivery: Secondary | ICD-10-CM

## 2012-10-13 DIAGNOSIS — O3400 Maternal care for unspecified congenital malformation of uterus, unspecified trimester: Secondary | ICD-10-CM

## 2012-10-13 DIAGNOSIS — O09219 Supervision of pregnancy with history of pre-term labor, unspecified trimester: Secondary | ICD-10-CM

## 2012-10-13 DIAGNOSIS — Z23 Encounter for immunization: Secondary | ICD-10-CM

## 2012-10-13 LAB — POCT URINALYSIS DIP (DEVICE)
Ketones, ur: NEGATIVE mg/dL
Nitrite: NEGATIVE
Protein, ur: NEGATIVE mg/dL

## 2012-10-13 MED ORDER — HYDROXYPROGESTERONE CAPROATE 250 MG/ML IM OIL
250.0000 mg | TOPICAL_OIL | Freq: Once | INTRAMUSCULAR | Status: AC
  Administered 2012-10-13: 250 mg via INTRAMUSCULAR

## 2012-10-13 MED ORDER — TETANUS-DIPHTH-ACELL PERTUSSIS 5-2.5-18.5 LF-MCG/0.5 IM SUSP: 0.5000 mL | Freq: Once | INTRAMUSCULAR | Status: DC

## 2012-10-13 NOTE — Progress Notes (Signed)
Reports increased vaginal pressure > no change in cervical exam; reviewed labor precautions; 17p today and Tdap.  Repeat csection consent signed.

## 2012-10-13 NOTE — Addendum Note (Signed)
Addended by: Gerome Apley on: 10/13/2012 11:27 AM   Modules accepted: Orders

## 2012-10-13 NOTE — Progress Notes (Signed)
Pulse- 90 Edema-hands/feet/face  Pressure- vaginal

## 2012-10-17 ENCOUNTER — Encounter: Payer: Self-pay | Admitting: *Deleted

## 2012-10-20 ENCOUNTER — Ambulatory Visit: Payer: Self-pay

## 2012-10-25 ENCOUNTER — Encounter: Payer: Self-pay | Admitting: *Deleted

## 2012-10-27 ENCOUNTER — Other Ambulatory Visit: Payer: Self-pay | Admitting: Obstetrics & Gynecology

## 2012-10-27 ENCOUNTER — Ambulatory Visit (INDEPENDENT_AMBULATORY_CARE_PROVIDER_SITE_OTHER): Payer: Self-pay | Admitting: Obstetrics & Gynecology

## 2012-10-27 VITALS — BP 105/58 | Temp 97.1°F | Wt 209.5 lb

## 2012-10-27 DIAGNOSIS — O09219 Supervision of pregnancy with history of pre-term labor, unspecified trimester: Secondary | ICD-10-CM

## 2012-10-27 DIAGNOSIS — O34219 Maternal care for unspecified type scar from previous cesarean delivery: Secondary | ICD-10-CM

## 2012-10-27 LAB — POCT URINALYSIS DIP (DEVICE)
Glucose, UA: NEGATIVE mg/dL
Nitrite: NEGATIVE
Specific Gravity, Urine: 1.02 (ref 1.005–1.030)
Urobilinogen, UA: 0.2 mg/dL (ref 0.0–1.0)

## 2012-10-27 LAB — OB RESULTS CONSOLE GBS: GBS: POSITIVE

## 2012-10-27 MED ORDER — HYDROXYPROGESTERONE CAPROATE 250 MG/ML IM OIL
250.0000 mg | TOPICAL_OIL | Freq: Once | INTRAMUSCULAR | Status: AC
  Administered 2012-10-27: 250 mg via INTRAMUSCULAR

## 2012-10-27 NOTE — Progress Notes (Signed)
Patient is Spanish-speaking only, Spanish interpreter present for this encounter.  She still insists she wants a repeat cesarean section; she is "scared" of undergoing vaginal delivery because her "instincts say RCS is safer for this delivery". Will send an order to surgical scheduler to book RCS, does not want BTL.  Pelvic cultures done, intact membranes palpated, no pool.  Last 17-P injection today.  No other complaints or concerns.  Fetal movement and labor precautions reviewed.

## 2012-10-27 NOTE — Progress Notes (Signed)
Pulse- 85  Edema-arms/hands/feet/legs/face Pt awoke 4am this morning with a sharp pain and then underwear was wet

## 2012-10-27 NOTE — Patient Instructions (Signed)
Return to clinic for any obstetric concerns or go to [facility] for evaluation  

## 2012-10-31 ENCOUNTER — Encounter: Payer: Self-pay | Admitting: Obstetrics & Gynecology

## 2012-10-31 DIAGNOSIS — O99820 Streptococcus B carrier state complicating pregnancy: Secondary | ICD-10-CM | POA: Insufficient documentation

## 2012-10-31 LAB — CULTURE, BETA STREP (GROUP B ONLY)

## 2012-11-03 ENCOUNTER — Ambulatory Visit (INDEPENDENT_AMBULATORY_CARE_PROVIDER_SITE_OTHER): Payer: Self-pay | Admitting: Obstetrics & Gynecology

## 2012-11-03 VITALS — BP 107/66 | Temp 97.0°F | Wt 209.9 lb

## 2012-11-03 DIAGNOSIS — O09893 Supervision of other high risk pregnancies, third trimester: Secondary | ICD-10-CM

## 2012-11-03 DIAGNOSIS — O09899 Supervision of other high risk pregnancies, unspecified trimester: Secondary | ICD-10-CM

## 2012-11-03 LAB — POCT URINALYSIS DIP (DEVICE)
Ketones, ur: NEGATIVE mg/dL
Nitrite: NEGATIVE
Protein, ur: NEGATIVE mg/dL
Urobilinogen, UA: 0.2 mg/dL (ref 0.0–1.0)
pH: 6 (ref 5.0–8.0)

## 2012-11-03 NOTE — Progress Notes (Signed)
Pressure and pain, mucus discharge. Still wants repeat CS. Labor precautions given.

## 2012-11-03 NOTE — Patient Instructions (Signed)
Parto por cesrea, Cuidados posteriores  (Cesarean Delivery, Care After) Siga estas instrucciones durante las prximas semanas. Estas indicaciones le proporcionan informacin general acerca de cmo deber cuidarse despus del procedimiento. El mdico tambin podr darle instrucciones especficas. El tratamiento ha sido planificado segn las prcticas mdicas actuales, pero en algunos casos pueden ocurrir problemas. Comunquese con el mdico si tiene algn problema o tiene preguntas despus del procedimiento.  INSTRUCCIONES PARA EL CUIDADO EN EL HOGAR  La curacin puede demorar algn tiempo. Puede sentir molestias, sensibilidad, hinchazn y hematomas en el sitio de la operacin, durante algunas semanas. Esto es normal y mejorar a medida que pase el tiempo.  Actividad  Al volver a su casa, durante las 2 primeras semanas descanse siempre que pueda.  Siempre que le sea posible, solicite ayuda para realizar las actividades domsticas y para el cuidado del beb, durante 2  3 semanas.  Limite las tareas domsticas y la actividad social. Aumente gradualmente su actividad a medida que recupera la fuerza.  No suba escaleras ms de 2 o 3 veces por da.  No levante nada que sea ms pesado que su beb.  Siga las instrucciones de su mdico con respecto a conducir automviles.  Consulte con su mdico si puede practicar ejercicios. Nutricin  Puede volver a su dieta habitual. Consuma una dieta normal y bien balanceada.  Debe ingerir gran cantidad de lquido para mantener la orina de tono claro o color amarillo plido.  Siga tomando los suplementos para el perodo prenatal o el complejo multivitamnico.  No beba alcohol hasta que el mdico la autorice. Evacuacin Debe retornar a su funcin intestinal habitual. Si est constipada, consulte a su mdico para tomar un laxante suave que la ayudar a ir al bao. Los lquidos y los alimentos que contengan salvado la ayudarn para el problema de la  constipacin. Gradualmente agregue frutas, verduras y salvado a su dieta. Higiene  Puede darse una ducha, lavarse el cabello y usar la baera, excepto que su mdico le indique otra cosa.  Contine con los cuidados perineales hasta que la secrecin vaginal se detenga.  No se haga duchas vaginales ni use tampones hasta que el mdico la autorice. Fiebre Si se siente afiebrada o tiene escalofros, tmese la temperatura. La fiebre puede indicar que hay una infeccin. Las infecciones se tratan con medicamentos.  Control del Dolor  Tome slo medicamentos de venta libre o prescriptos, segn las indicaciones del mdico. No tome aspirina. Puede ocasionar hemorragias.  No conduzca mientras toma analgsicos.  Consulte a su mdico para volver a tomar o ajustar las dosis de sus medicamentos habituales. Cuidados de la Incisin  Limpie la herida (incisin) suavemente con jabn y agua.  Si el mdico la autoriza, deje la herida sin vendaje, excepto que observe supuracin o irritacin.  Si tiene pequeas tiras adhesivas que cruzan la incisin y no se caen dentro de los 7 das, retrelas suavemente.  Controle diariamente la incisin y observe si aumenta el enrojecimiento, supura, se hincha o se separa la piel.  Abrace una almohada cuando tosa o estornude. Esto ayuda a aliviar el dolor. Cuidados Vaginales La secrecin vaginal o la hemorragia pueden durar hasta 6 semanas. Si esta secrecin se torna de color rojo brillante, presenta un olor ftido, es demasiado abundante, contiene cogulos o si siente ardor al orinar o debe hacerlo con mucha frecuencia, llame al profesional que la asiste. Si la hemorragia disminuye y luego se hace ms intensa, es su organismo que le dice que debe disminuir la actividad   y relajarse ms. Relaciones Sexuales  Consulte a su mdico antes de reanudar la actividad sexual. Generalmente, luego de 4 a 6 semanas, si se siente bien y descansada, podr reanudar la actividad sexual.  Evite las posiciones que fuercen el sitio de la incisin.  Puede quedar embarazada antes de tener el perodo. Si reanuda las relaciones sexuales, debe utilizar algn mtodo anticonceptivo si no quiere quedar embarazada enseguida. Prcticas Saludables  Cumpla con todas las visitas de control, segn le indique su mdico. Generalmente el mdico indicar que vuelva en 2  3 semanas.  Contine con los exmenes plvicos anuales.  Contine con el autoexamen de mamas mensual y los exmenes anuales que incluyan un [place]. Cuidado de las Mamas  Si no est amamantando, y sus mamas se tornan sensibles, se endurecen o la leche gotea, puede usar un sostn que le ajuste firmemente y aplicar hielo.  Si est amamantando, use un buen sostn.  Comunquese con el profesional que la asiste si siente dolor en las mamas, sntomas similares a una gripe, fiebre o endurecimiento y enrojecimiento de las mamas. Depresin Posparto Despus del entusiasmo por la llegada del beb, generalmente puede suceder un perodo de abatimiento o depresin. Comente sus sensaciones con su pareja, familiares y amigos. Puede estar originado en la modificacin de los niveles de hormonas en el organismo. Si esto la preocupa, puede contactarse con el profesional que la asiste. MISCELNEAS  Limite el uso de bombachas de sostn o medias panty.  Si amamanta, puede ser que no tenga su perodo menstrual por algunos meses o ms. Esto es normal en una mujer que amamanta. Si no menstra dentro de las 6 semanas posteriores a la interrupcin de la lactancia, consulte a su mdico.  Si no est amamantando, debe esperar que comience a menstruar dentro de las 6 a 12 semanas posteriores al parto. Si no ha comenzado para la 11 semana, consulte a su mdico. SOLICITE ATENCIN MDICA SI:  Presenta enrojecimiento, hinchazn o aumento del dolor en la herida.  Aparece pus en la herida.  Advierte un olor ftido que proviene de la herida o del  vendaje.  La zona de la inyeccin intravenosa se hincha, se inflama o duele.  La herida se abre (los bordes no estn unidos).  Se siente mareada o sufre un desmayo.  Siente dolor al orinar u orina con sangre.  Presenta diarrea.  Presenta nuseas o vmitos.  Observa una secrecin vaginal anormal.  Aparece una erupcin cutnea.  Sufre algn tipo de reaccin anormal o alrgica debido a los medicamentos que toma.  El dolor no se alivia con los medicamentos o empeora.  Su temperatura es de 101 F (38.3 C), o es 100.4 F (38 C) tomada 2 veces en un perodo de 4 horas. SOLICITE ATENCIN MDICA DE INMEDIATO SI:  La temperatura se eleva por encima de 102 F (38.9 C).  Siente dolor abdominal.  Siente dolor en el pecho.  Le falta el aire.  Se desmaya.  Presenta dolor, enrojecimiento o hinchazn en las piernas.  Sufre una hemorragia intensa con o sin cogulos de sangre. Document Released: 08/24/2005 Document Revised: 11/16/2011 ExitCare Patient Information 2013 [employer].  

## 2012-11-03 NOTE — Progress Notes (Signed)
Pulse- 86  Pain/pressure-vaginal  Edema-vagina/rectum

## 2012-11-10 ENCOUNTER — Encounter (HOSPITAL_COMMUNITY): Payer: Self-pay | Admitting: Anesthesiology

## 2012-11-10 ENCOUNTER — Inpatient Hospital Stay (HOSPITAL_COMMUNITY)
Admission: AD | Admit: 2012-11-10 | Discharge: 2012-11-12 | DRG: 775 | Disposition: A | Discharge status: Home / Self Care | Payer: Medicaid Other | Source: Ambulatory Visit | Attending: Obstetrics & Gynecology | Admitting: Obstetrics & Gynecology

## 2012-11-10 ENCOUNTER — Ambulatory Visit (INDEPENDENT_AMBULATORY_CARE_PROVIDER_SITE_OTHER): Payer: Medicaid Other | Admitting: Advanced Practice Midwife

## 2012-11-10 ENCOUNTER — Encounter (HOSPITAL_COMMUNITY): Payer: Self-pay | Admitting: *Deleted

## 2012-11-10 VITALS — BP 114/71 | Temp 98.0°F | Wt 211.2 lb

## 2012-11-10 DIAGNOSIS — O9989 Other specified diseases and conditions complicating pregnancy, childbirth and the puerperium: Secondary | ICD-10-CM

## 2012-11-10 DIAGNOSIS — O34219 Maternal care for unspecified type scar from previous cesarean delivery: Secondary | ICD-10-CM | POA: Diagnosis present

## 2012-11-10 DIAGNOSIS — O09219 Supervision of pregnancy with history of pre-term labor, unspecified trimester: Secondary | ICD-10-CM

## 2012-11-10 DIAGNOSIS — O3400 Maternal care for unspecified congenital malformation of uterus, unspecified trimester: Secondary | ICD-10-CM

## 2012-11-10 DIAGNOSIS — IMO0001 Reserved for inherently not codable concepts without codable children: Secondary | ICD-10-CM

## 2012-11-10 DIAGNOSIS — O99892 Other specified diseases and conditions complicating childbirth: Secondary | ICD-10-CM | POA: Diagnosis present

## 2012-11-10 DIAGNOSIS — O99820 Streptococcus B carrier state complicating pregnancy: Secondary | ICD-10-CM

## 2012-11-10 DIAGNOSIS — Z22330 Carrier of Group B streptococcus: Secondary | ICD-10-CM

## 2012-11-10 HISTORY — DX: History of uterine scar from previous surgery: Z98.891

## 2012-11-10 LAB — CBC
MCHC: 33.1 g/dL (ref 30.0–36.0)
Platelets: 331 10*3/uL (ref 150–400)
RDW: 14.4 % (ref 11.5–15.5)
WBC: 10.3 10*3/uL (ref 4.0–10.5)

## 2012-11-10 LAB — POCT URINALYSIS DIP (DEVICE)
Glucose, UA: NEGATIVE mg/dL
Nitrite: NEGATIVE
Protein, ur: NEGATIVE mg/dL
Urobilinogen, UA: 0.2 mg/dL (ref 0.0–1.0)

## 2012-11-10 LAB — TYPE AND SCREEN: Antibody Screen: NEGATIVE

## 2012-11-10 LAB — RPR: RPR Ser Ql: NONREACTIVE

## 2012-11-10 MED ORDER — OXYTOCIN 40 UNITS IN LACTATED RINGERS INFUSION - SIMPLE MED: 1.0000 m[IU]/min | INTRAVENOUS | Status: DC

## 2012-11-10 MED ORDER — EPHEDRINE 5 MG/ML INJ: 10.0000 mg | INTRAVENOUS | Status: DC | PRN

## 2012-11-10 MED ORDER — ONDANSETRON HCL 4 MG/2ML IJ SOLN: 4.0000 mg | INTRAMUSCULAR | Status: DC | PRN

## 2012-11-10 MED ORDER — PENICILLIN G POTASSIUM 5000000 UNITS IJ SOLR
2.5000 10*6.[IU] | INTRAVENOUS | Status: DC
  Administered 2012-11-10: 2.5 10*6.[IU] via INTRAVENOUS
  Filled 2012-11-10 (×3): qty 2.5

## 2012-11-10 MED ORDER — OXYCODONE-ACETAMINOPHEN 5-325 MG PO TABS
1.0000 | ORAL_TABLET | ORAL | Status: DC | PRN
  Administered 2012-11-11 (×2): 1 via ORAL
  Administered 2012-11-11: 2 via ORAL
  Administered 2012-11-11: 1 via ORAL
  Administered 2012-11-12 (×2): 2 via ORAL
  Filled 2012-11-10: qty 1
  Filled 2012-11-10: qty 2
  Filled 2012-11-10: qty 1
  Filled 2012-11-10 (×3): qty 2
  Filled 2012-11-10: qty 1

## 2012-11-10 MED ORDER — SENNOSIDES-DOCUSATE SODIUM 8.6-50 MG PO TABS
2.0000 | ORAL_TABLET | Freq: Every day | ORAL | Status: DC
  Administered 2012-11-11: 2 via ORAL

## 2012-11-10 MED ORDER — OXYTOCIN BOLUS FROM INFUSION: 500.0000 mL | INTRAVENOUS | Status: DC

## 2012-11-10 MED ORDER — CITRIC ACID-SODIUM CITRATE 334-500 MG/5ML PO SOLN: 30.0000 mL | ORAL | Status: DC | PRN

## 2012-11-10 MED ORDER — LACTATED RINGERS IV SOLN
500.0000 mL | INTRAVENOUS | Status: DC | PRN
Start: 2012-11-10 — End: 2012-11-10

## 2012-11-10 MED ORDER — ZOLPIDEM TARTRATE 5 MG PO TABS: 5.0000 mg | ORAL_TABLET | Freq: Every evening | ORAL | Status: DC | PRN

## 2012-11-10 MED ORDER — LIDOCAINE HCL (PF) 1 % IJ SOLN
30.0000 mL | INTRAMUSCULAR | Status: DC | PRN
  Filled 2012-11-10 (×2): qty 30

## 2012-11-10 MED ORDER — WITCH HAZEL-GLYCERIN EX PADS: 1.0000 "application " | MEDICATED_PAD | CUTANEOUS | Status: DC | PRN

## 2012-11-10 MED ORDER — OXYCODONE-ACETAMINOPHEN 5-325 MG PO TABS: 1.0000 | ORAL_TABLET | ORAL | Status: DC | PRN

## 2012-11-10 MED ORDER — IBUPROFEN 600 MG PO TABS
600.0000 mg | ORAL_TABLET | Freq: Four times a day (QID) | ORAL | Status: DC
  Administered 2012-11-11 – 2012-11-12 (×5): 600 mg via ORAL
  Filled 2012-11-10 (×6): qty 1

## 2012-11-10 MED ORDER — ONDANSETRON HCL 4 MG PO TABS: 4.0000 mg | ORAL_TABLET | ORAL | Status: DC | PRN

## 2012-11-10 MED ORDER — ONDANSETRON HCL 4 MG/2ML IJ SOLN: 4.0000 mg | Freq: Four times a day (QID) | INTRAMUSCULAR | Status: DC | PRN

## 2012-11-10 MED ORDER — FENTANYL CITRATE 0.05 MG/ML IJ SOLN: 100.0000 ug | INTRAMUSCULAR | Status: DC | PRN

## 2012-11-10 MED ORDER — IBUPROFEN 600 MG PO TABS
600.0000 mg | ORAL_TABLET | Freq: Four times a day (QID) | ORAL | Status: DC | PRN
  Administered 2012-11-10: 600 mg via ORAL
  Filled 2012-11-10: qty 1

## 2012-11-10 MED ORDER — PRENATAL MULTIVITAMIN CH
1.0000 | ORAL_TABLET | Freq: Every day | ORAL | Status: DC
  Administered 2012-11-11 – 2012-11-12 (×2): 1 via ORAL
  Filled 2012-11-10 (×2): qty 1

## 2012-11-10 MED ORDER — DIPHENHYDRAMINE HCL 50 MG/ML IJ SOLN: 12.5000 mg | INTRAMUSCULAR | Status: DC | PRN

## 2012-11-10 MED ORDER — TERBUTALINE SULFATE 1 MG/ML IJ SOLN: 0.2500 mg | Freq: Once | INTRAMUSCULAR | Status: DC | PRN

## 2012-11-10 MED ORDER — DIPHENHYDRAMINE HCL 25 MG PO CAPS: 25.0000 mg | ORAL_CAPSULE | Freq: Four times a day (QID) | ORAL | Status: DC | PRN

## 2012-11-10 MED ORDER — BENZOCAINE-MENTHOL 20-0.5 % EX AERO
1.0000 "application " | INHALATION_SPRAY | CUTANEOUS | Status: DC | PRN
  Administered 2012-11-11: 1 via TOPICAL
  Filled 2012-11-10: qty 56

## 2012-11-10 MED ORDER — OXYTOCIN 40 UNITS IN LACTATED RINGERS INFUSION - SIMPLE MED
1.0000 m[IU]/min | INTRAVENOUS | Status: DC
  Administered 2012-11-10: 2 m[IU]/min via INTRAVENOUS
  Filled 2012-11-10: qty 1000

## 2012-11-10 MED ORDER — PENICILLIN G POTASSIUM 5000000 UNITS IJ SOLR
5.0000 10*6.[IU] | Freq: Once | INTRAVENOUS | Status: AC
  Administered 2012-11-10: 5 10*6.[IU] via INTRAVENOUS
  Filled 2012-11-10: qty 5

## 2012-11-10 MED ORDER — LACTATED RINGERS IV SOLN
INTRAVENOUS | Status: DC
  Administered 2012-11-10: 17:00:00 via INTRAVENOUS

## 2012-11-10 MED ORDER — LACTATED RINGERS IV BOLUS (SEPSIS)
1000.0000 mL | Freq: Once | INTRAVENOUS | Status: AC
  Administered 2012-11-10: 1000 mL via INTRAVENOUS

## 2012-11-10 MED ORDER — FENTANYL 2.5 MCG/ML BUPIVACAINE 1/10 % EPIDURAL INFUSION (WH - ANES): 14.0000 mL/h | INTRAMUSCULAR | Status: DC

## 2012-11-10 MED ORDER — PHENYLEPHRINE 40 MCG/ML (10ML) SYRINGE FOR IV PUSH (FOR BLOOD PRESSURE SUPPORT): 80.0000 ug | PREFILLED_SYRINGE | INTRAVENOUS | Status: DC | PRN

## 2012-11-10 MED ORDER — LANOLIN HYDROUS EX OINT: TOPICAL_OINTMENT | CUTANEOUS | Status: DC | PRN

## 2012-11-10 MED ORDER — ACETAMINOPHEN 325 MG PO TABS: 650.0000 mg | ORAL_TABLET | ORAL | Status: DC | PRN

## 2012-11-10 MED ORDER — FENTANYL CITRATE 0.05 MG/ML IJ SOLN
50.0000 ug | Freq: Once | INTRAMUSCULAR | Status: AC
  Administered 2012-11-10: 50 ug via INTRAVENOUS
  Filled 2012-11-10: qty 2

## 2012-11-10 MED ORDER — OXYTOCIN 40 UNITS IN LACTATED RINGERS INFUSION - SIMPLE MED: 62.5000 mL/h | INTRAVENOUS | Status: DC

## 2012-11-10 MED ORDER — SIMETHICONE 80 MG PO CHEW: 80.0000 mg | CHEWABLE_TABLET | ORAL | Status: DC | PRN

## 2012-11-10 MED ORDER — LACTATED RINGERS IV SOLN: 500.0000 mL | Freq: Once | INTRAVENOUS | Status: DC

## 2012-11-10 MED ORDER — TETANUS-DIPHTH-ACELL PERTUSSIS 5-2.5-18.5 LF-MCG/0.5 IM SUSP: 0.5000 mL | Freq: Once | INTRAMUSCULAR | Status: DC

## 2012-11-10 MED ORDER — DIBUCAINE 1 % RE OINT: 1.0000 "application " | TOPICAL_OINTMENT | RECTAL | Status: DC | PRN

## 2012-11-10 NOTE — H&P (Signed)
HPI: Janice Chase is a 32 y.o. year old [id number] female at [redacted]w[redacted]d weeks gestation who presents to MAU reporting frequent contractions x 2 weeks, worsening q 2-4 minutes. Denies LOF or VB. Pos FM. Observed for labor in MAU. Small change to 5/60/-3, but concern for multip w/ advanced dilation, GBS pos missing prophylaxis if discharged and concern for contracting w/ scarred uterus. Received care at [facility] for Hx PTD x 3 and septate uterus. Prior C/S x 1 for breech followed by successful VBAC x 2.   Patient Active Problem List  Diagnosis  . History of preterm delivery, currently pregnant  . Septate uterus complicating pregnancy  . Supervision of other high-risk pregnancy  . History of cesarean delivery, currently pregnant. 16 years ago. LTCS, double-layer closure  . Group B Streptococcus carrier, antepartum    Office  HRC  Genetic Screen  Quad normal, CF neg   Anatomic [place]  Normal female.  Glucose Screen  Early 80, 28 wk 101  GC / Chlamydia  N/N  GBS  Positive   Feeding Preference  Breast  Contraception  Undecided  Peds  ?  Circumcision  NA     Maternal Medical History:  Reason for admission: Nausea.    OB History   Grav Para Term Preterm Abortions TAB SAB Ect Mult Living   4 3 0 3      3     Past Medical History  Diagnosis Date  . No pertinent past medical history   . Abnormal Pap smear    Past Surgical History  Procedure Laterality Date  . Cesarean section     Family History: family history includes Diabetes in her father.  There is no history of Other. Social History:  reports that she has quit smoking. She quit smokeless tobacco use about 7 years ago. She reports that she does not drink alcohol or use illicit drugs.   Prenatal Transfer Tool  Maternal Diabetes: No Genetic Screening: Normal Maternal Ultrasounds/Referrals: Normal Fetal Ultrasounds or other Referrals:  None Maternal Substance Abuse:  No Significant Maternal Medications:  Meds include: Other:  17-P Significant Maternal Lab Results:  Lab values include: Group B Strep positive Other Comments:  None  Review of Systems  Constitutional: Negative for fever and chills.  Eyes: Negative for blurred vision.  Gastrointestinal: Positive for abdominal pain (contractions, pelvic pressure). Negative for nausea and vomiting.  Genitourinary: Negative for dysuria, urgency, frequency and hematuria.  Neurological: Negative for headaches.    Dilation: 5 Effacement (%): 60 Station: -3 Exam by:: V. Marilyn Wing CNM Blood pressure 107/54, pulse 86, temperature 97.9 F (36.6 C), temperature source Oral, resp. rate 20, height 4\' 8"  (1.422 m), weight 95.981 kg (211 lb 9.6 oz), last menstrual period 02/16/2012. Maternal Exam:  Uterine Assessment: Contraction strength is moderate.  Contraction frequency is irregular.   Abdomen: Fundal height is S=D.   Fetal presentation: vertex  Introitus: Normal vulva. Vagina is negative for discharge.  Pelvis: adequate for delivery.   Cervix: Cervix evaluated by digital exam.     Fetal Exam Fetal Monitor Review: Mode: ultrasound.   Baseline rate: 130.  Variability: moderate (6-25 bpm).   Pattern: accelerations present and no decelerations.    Fetal State Assessment: Category I - tracings are normal.     Physical Exam  Nursing note and vitals reviewed. Constitutional: She is oriented to person, place, and time. She appears well-developed and well-nourished. She appears distressed.  HENT:  Head: Normocephalic.  Cardiovascular: Normal rate, regular rhythm and normal  heart sounds.   Respiratory: Effort normal and breath sounds normal.  GI: Soft. There is no tenderness.  Genitourinary: Vagina normal and uterus normal. No vaginal discharge found.  Musculoskeletal: Normal range of motion. She exhibits edema (1+). She exhibits no tenderness.  Neurological: She is alert and oriented to person, place, and time. She has normal reflexes.  Skin: Skin is warm and dry.   Psychiatric: She has a normal mood and affect.    Prenatal labs: ABO, Rh: --/--/O POS (03/06 1320) Antibody: NEG (03/06 1317) Rubella:   RPR: NON REAC (12/26 1233)  HBsAg: Negative (10/21 0000)  HIV:   NR GBS:   Pos 1 hour GTT: 80, 101  .Assessment: 1. Labor: early/active 2. Fetal Wellbeing: Category I  3. Pain Control: Fentanyl, epidural PRN 4. GBS: pos 5. 38.2 week IUP 6. Pt consents to TOLAC.  Plan:  1. Admit to BS per consult with MD 2. Routine L&D orders 3. Analgesia/anesthesia PRN  4. PCN for GSB 5. Augmentation PRN.  Dorathy Kinsman 11/10/2012, 5:36 PM

## 2012-11-10 NOTE — MAU Note (Signed)
Patient states she has been having pain for 2 weeks but for the past week has had a lot of pain. Denies leaking or bleeding and reports good fetal movement.

## 2012-11-10 NOTE — Progress Notes (Signed)
Pulse: 83 Pt complains of lots of pelvic pain and pressure. She stated that baby moves a little less than before, however she is still feeling movement throughout the day.

## 2012-11-10 NOTE — MAU Provider Note (Signed)
Attestation of Attending Supervision of Advanced Practitioner (CNM/NP): Evaluation and management procedures were performed by the Advanced Practitioner under my supervision and collaboration.  I have reviewed the Advanced Practitioner's note and chart, and I agree with the management and plan. Patient seen and examined.  She H&P note.   HARRAWAY-SMITH, CAROLYN 6:26 PM

## 2012-11-10 NOTE — H&P (Signed)
I spoke to the patient in detail about the risks of vaginal delivery vs repeat c-sections.  The patient had expressed hesitency to deliver vaginally in the past.  In inquiring as to why she reports that she is afraid of the pain.  She was not adequately treated for pain in her past vaginal deliveries and this was her fear.  We talked about options for pain control and the patient said that she 'feels that a vaginal delivery will be best for her and the baby.'   She  Will be admitted for augmentation of labor and epidural prn.  Carolyn L. Harraway-Smith, M.D., [place]n [place]

## 2012-11-10 NOTE — Progress Notes (Signed)
Painful contraction and moderate pelvic pressure today. Decreased FM this morning. Baby active on exam. Slight change in cervical exam. Pt moderate uncomfortable. Per consult w/ Dr. Erin Fulling will send tp MAU for labor eval. Plans Repeat C/S. Has had 2 successful VBAC's. Asked if she would like to have VBAC if labor progresses. Pt still declines.

## 2012-11-10 NOTE — MAU Provider Note (Signed)
Chief Complaint:  Labor Eval   First Provider Initiated Contact with Patient 11/10/12 1301      HPI: Janice Chase is a 32 y.o. [phone] at [redacted]w[redacted]d who was sent to maternity admissions reporting from [facility] for painful contractions, possible cervical change from last exam. Plans repeat C/S at 39 weeks. Has had 2 successful VBACs. Denies leakage of fluid, incision pain or vaginal bleeding. Good fetal movement. Cervix 4.5/50/-3, ballotable. Pt requesting pain medication.   Pregnancy Course: Seen in [facility] for Hx PTD, septate uterus.   Patient Active Problem List  Diagnosis  . History of preterm delivery, currently pregnant  . Septate uterus complicating pregnancy  . Supervision of other high-risk pregnancy  . History of cesarean delivery, currently pregnant  . Group B Streptococcus carrier, antepartum   Past Medical History: Past Medical History  Diagnosis Date  . No pertinent past medical history   . Abnormal Pap smear     Past obstetric history: OB History   Grav Para Term Preterm Abortions TAB SAB Ect Mult Living   4 3 0 3      3     # Outc Date GA Lbr Len/2nd Wgt Sex Del Anes PTL Lv   1 PRE 3/98 [redacted]w[redacted]d   M LTCS  Yes    Comments: baby was breech   2 PRE 10/08 [redacted]w[redacted]d   M VBAC None Yes Yes   3 PRE 6/10 [redacted]w[redacted]d  1.361kg(3lb) F SVD EPI Yes Yes   4 CUR               Past Surgical History: Past Surgical History  Procedure Laterality Date  . Cesarean section      Family History: Family History  Problem Relation Age of Onset  . Other Neg Hx   . Diabetes Father     Social History: History  Substance Use Topics  . Smoking status: Former Games developer  . Smokeless tobacco: Former Neurosurgeon    Quit date: 04/28/2005  . Alcohol Use: No     Comment: Used cocaine 7 years ago    Allergies: No Known Allergies  Meds:  Prescriptions prior to admission  Medication Sig Dispense Refill  . Prenatal Vit-Fe Fumarate-FA (PRENATAL MULTIVITAMIN) TABS Take 1 tablet by mouth daily at 12 noon.         ROS: Pertinent findings in history of present illness.  Physical Exam  Blood pressure 112/59, pulse 96, temperature 97.6 F (36.4 C), temperature source Oral, resp. rate 18, height 4\' 8"  (1.422 m), weight 95.981 kg (211 lb 9.6 oz), last menstrual period 02/16/2012. GENERAL: Well-developed, well-nourished female in mild distress.  HEENT: normocephalic HEART: normal rate RESP: normal effort ABDOMEN: Soft, non-tender, gravid appropriate for gestational age EXTREMITIES: Nontender, no edema NEURO: alert and oriented SPECULUM EXAM: Deferred  Dilation: 5 Effacement (%): 60 Cervical Position: Posterior Station: -3 Presentation: Vertex Exam by:: Ivonne Andrew CNM  FHT:  Baseline 130 , moderate variability, accelerations present, no decelerations Contractions: q 2-4 mins, mild to moderate   Labs: Results for orders placed during the hospital encounter of 11/10/12 (from the past 24 hour(s))  CBC     Status: None   Collection Time    11/10/12  1:17 PM      Result Value Range   WBC 10.3  4.0 - 10.5 K/uL   RBC 4.20  3.87 - 5.11 MIL/uL   Hemoglobin 12.2  12.0 - 15.0 g/dL   HCT 14.7  82.9 - 56.2 %   MCV 87.9  78.0 - 100.0 fL   MCH 29.0  26.0 - 34.0 pg   MCHC 33.1  30.0 - 36.0 g/dL   RDW 04.5  40.9 - 81.1 %   Platelets 331  150 - 400 K/uL  TYPE AND SCREEN     Status: None   Collection Time    11/10/12  1:17 PM      Result Value Range   ABO/RH(D) O POS     Antibody Screen NEG     Sample Expiration 11/13/2012    ABO/RH     Status: None   Collection Time    11/10/12  1:20 PM      Result Value Range   ABO/RH(D) O POS      Imaging:  No results found. MAU Course: Contractions decreased with IV fluids and Fentanyl. Pt reports mild decrease in pain, but looks less uncomfortable. Small amount of cervical change, but cervix thinner, softer, BBOW.   Dilation: 5 Effacement (%): 60 Cervical Position: Posterior Station: -3 Presentation: Vertex Exam by:: Ivonne Andrew CNM Pos bloody show.   Discussed small amount of cervical change  w/ Dr. Erin Fulling and concern for discharging a multip with current cervical exam.  Dr. Erin Fulling came to [facility] to discuss repeat C/S vs VBAC. Pt primarily concerned about pain in labor. Also some concern about going further along this pregnancy than others and possible bigger baby. Pt consents to VBAC if pain can be well-managed. Dr. Erin Fulling discussed IV pain meds and epidural PRN. Will not hesitate to proceed with RC/S for protracted labor.    Assessment: 1. Group B Streptococcus carrier, antepartum   2. History of cesarean delivery, currently pregnant   3. Active labor     Plan: Admit to BS.  [place], [place] 11/10/2012 4:29 PM

## 2012-11-11 ENCOUNTER — Encounter: Payer: Self-pay | Admitting: Advanced Practice Midwife

## 2012-11-11 ENCOUNTER — Encounter (HOSPITAL_COMMUNITY): Payer: Self-pay | Admitting: *Deleted

## 2012-11-11 NOTE — Progress Notes (Signed)
Post Partum Day 1 Subjective: no complaints, up ad lib, voiding, tolerating PO and breast/bottlefeeding.  Desires paraguard IUD.  Objective: Blood pressure 106/62, pulse 76, temperature 98.2 F (36.8 C), temperature source Oral, resp. rate 20, height 4\' 8"  (1.422 m), weight 95.981 kg (211 lb 9.6 oz), last menstrual period 02/16/2012, SpO2 100.00%, unknown if currently breastfeeding.  Physical Exam:  General: alert, cooperative and appears stated age Lochia: appropriate Uterine Fundus: firm Incision: n/a DVT Evaluation: No evidence of DVT seen on physical exam. Negative Homan's sign.   Recent Labs  11/10/12 1317  HGB 12.2  HCT 36.9    Assessment/Plan: Plan for discharge tomorrow and Breastfeeding   LOS: 1 day   [facility] 11/11/2012, 9:32 AM

## 2012-11-11 NOTE — Clinical Social Work Maternal (Signed)
    Clinical Social Work Department PSYCHOSOCIAL ASSESSMENT - MATERNAL/CHILD 11/11/2012  Patient:  FORRESTINE, LECRONE  Account Number:  [id number]  Admit Date:  11/10/2012  Marjo Bicker Name:   BG Andrena Mews    Clinical Social Worker:  Andy Gauss   Date/Time:  11/11/2012 12:38 PM  Date Referred:  11/11/2012   Referral source  CN     Referred reason  Depression/Anxiety   Other referral source:    I:  FAMILY / HOME ENVIRONMENT Child's legal guardian:  PARENT  Guardian - Name Guardian - Age Guardian - Address  Annaleigha Woo [address]; [place], [place] [address]  Sherie Don 30    Other household support members/support persons Name Relationship DOB   SON 2008   DAUGHTER 2010   Other support:    II  PSYCHOSOCIAL DATA Information Source:  Patient Interview  Event organiser Employment:   Surveyor, quantity resources:  Self Pay If Medicaid - [employer]:   Clinical biochemist  WIC   School / Grade:   Maternity Care Coordinator / Child Services Coordination / Early Interventions:  Cultural issues impacting care:    III  STRENGTHS Strengths  Adequate Resources  Home prepared for Child (including basic supplies)  Supportive family/friends   Strength comment:    IV  RISK FACTORS AND CURRENT PROBLEMS Current Problem:  YES   Risk Factor & Current Problem Patient Issue Family Issue Risk Factor / Current Problem Comment  Mental Illness Y N Hx 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  She participated in counseling sessions at [facility] of the [place] for 2 months. According to the pt, counseling was helpful.  Pt expressed some depressed moods at the beginning of the pregnancy.  This pregnancy was unplanned but she does not identify the pregnancy as the source of depression, rather issues with her oldest son.  Pt's  son was not obeying the rules & therefore went to live with his father.  Pt told CSW that she was depressed because she & her son weren't speaking.  She still has not talked to her son but reports that she is feeling fine now.  She denies any history of SI.  Pt agrees to seek medical attention &/or therapy if PP depression symptoms arise again.  She has good support & all the necessary supplies for the infant.  FOB at bedside, aware of history & supportive.  CSW available to assist further if needed.      VI SOCIAL WORK PLAN Social Work Plan  No Further Intervention Required / No Barriers to Discharge   Type of pt/family education:   If child protective services report - county:   If child protective services report - date:   Information/referral to community resources comment:   Other social work plan:

## 2012-11-11 NOTE — Progress Notes (Signed)
UR completed 

## 2012-11-12 DIAGNOSIS — O34219 Maternal care for unspecified type scar from previous cesarean delivery: Secondary | ICD-10-CM

## 2012-11-12 DIAGNOSIS — O9989 Other specified diseases and conditions complicating pregnancy, childbirth and the puerperium: Secondary | ICD-10-CM

## 2012-11-12 MED ORDER — IBUPROFEN 600 MG PO TABS: 600.0000 mg | ORAL_TABLET | Freq: Four times a day (QID) | ORAL | Status: DC

## 2012-11-12 NOTE — Discharge Summary (Signed)
Attestation of Attending Supervision of Advanced Practitioner (CNM/NP): Evaluation and management procedures were performed by the Advanced Practitioner under my supervision and collaboration.  I have reviewed the Advanced Practitioner's note and chart, and I agree with the management and plan.  CONSTANT,PEGGY 11/12/2012 11:29 AM

## 2012-11-12 NOTE — Discharge Summary (Signed)
Obstetric Discharge Summary Reason for Admission: onset of labor- minimal cx change in MAU, but cx with advanced dilation of 5/60/-3, GBS +, and for TOLAC so decision made for admission & induction Prenatal Procedures: none Intrapartum Procedures: spontaneous vaginal delivery and GBS prophylaxis Postpartum Procedures: none Complications-Operative and Postpartum: 1st degree perineal laceration Hemoglobin  Date Value Range Status  11/10/2012 12.2  12.0 - 15.0 g/dL Final  40/98/1191 47.8   Final     HCT  Date Value Range Status  11/10/2012 36.9  36.0 - 46.0 % Final  06/27/2012 36   Final   L&D course: Pt received 2 doses of PCN. Due to difficulty in assessing contractions/?early vs late decels, AROM was performed with clear fluid and IUPC/FSE were applied. PT was 5/80/-2 at this time. 2 contractions later, with massive pushing on the pt's part, she quickly delivered through a now completely dilated cervix.   Physical Exam:  General: alert, cooperative and no distress Heart: RRR Lungs: nl efford Lochia: appropriate Uterine Fundus: firm DVT Evaluation: No evidence of DVT seen on physical exam.  Discharge Diagnoses: Term Pregnancy-delivered  Discharge Information: Date: 11/12/2012 Activity: pelvic rest Diet: routine Medications: PNV and Ibuprofen Condition: stable Instructions: refer to practice specific booklet Discharge to: home Follow-up Information   Follow up with [facility]. (Make a postpartum appointment for 4-6 weeks)    Contact information:   [address] [place] [address] [phone]      Newborn Data: Live born female  Birth Weight: 7 lb 2 oz (3232 g) APGAR: 9, 9  Home with mother. Breast and bottle feeding; desires Paragard for contraception.  Janice Chase 11/12/2012, 10:52 AM

## 2012-11-15 ENCOUNTER — Encounter (HOSPITAL_COMMUNITY): Admission: RE | Payer: Self-pay | Source: Ambulatory Visit

## 2012-11-15 ENCOUNTER — Inpatient Hospital Stay (HOSPITAL_COMMUNITY): Admission: RE | Admit: 2012-11-15 | Payer: Self-pay | Source: Ambulatory Visit | Admitting: Obstetrics & Gynecology

## 2012-11-15 SURGERY — Surgical Case
Anesthesia: Regional | Site: Abdomen

## 2012-11-23 ENCOUNTER — Telehealth (HOSPITAL_COMMUNITY): Payer: Self-pay | Admitting: *Deleted

## 2012-11-23 NOTE — Telephone Encounter (Signed)
Resolve episode 

## 2012-12-12 ENCOUNTER — Ambulatory Visit (INDEPENDENT_AMBULATORY_CARE_PROVIDER_SITE_OTHER): Payer: Self-pay | Admitting: Family

## 2012-12-12 ENCOUNTER — Encounter: Payer: Self-pay | Admitting: Family

## 2012-12-12 VITALS — BP 101/70 | HR 75 | Temp 97.3°F | Ht 59.0 in | Wt 199.0 lb

## 2012-12-12 DIAGNOSIS — O09893 Supervision of other high risk pregnancies, third trimester: Secondary | ICD-10-CM

## 2012-12-12 DIAGNOSIS — R87613 High grade squamous intraepithelial lesion on cytologic smear of cervix (HGSIL): Secondary | ICD-10-CM | POA: Insufficient documentation

## 2012-12-12 NOTE — Addendum Note (Signed)
Addended by: Kathee Delton on: 12/12/2012 04:49 PM   Modules accepted: Orders

## 2012-12-12 NOTE — Progress Notes (Signed)
  Subjective:     Janice Chase is a 32 y.o. female who presents for a postpartum visit. She is 4 weeks postpartum following a spontaneous vaginal delivery. I have fully reviewed the prenatal and intrapartum course. The delivery was at 38 gestational weeks. Outcome: spontaneous vaginal delivery. Anesthesia: none. Postpartum course has been unremarkable other than depression. Baby's course has been unremarkable. Baby is feeding by both breast and bottle - Gerber. Bleeding no bleeding.  Stopped two weeks ago. Bowel function is normal. Bladder function is normal. Patient is sexually active.  Last intercourse yesterday, withdrawal method.   Contraception method is coitus interruptus. Desires Mirena postpartum.  Postpartum depression screening: positive.  Pt currently experiencing depressive moods due to issues with son (46 yo) who has recently moved from the home.  Appointment with counseling already scheduled for this Wednesday.  Denies suicidal ideation.    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  Records from health department state that normal pap smear results 2011-2013.  The following portions of the patient's history were reviewed and updated as appropriate: allergies, current medications, past family history, past medical history, past social history, past surgical history and problem list.  Review of Systems Pertinent items are noted in HPI.   Objective:    BP 101/70  Pulse 75  Temp(Src) 97.3 F (36.3 C) (Oral)  Ht 4\' 11"  (1.499 m)  Wt 199 lb (90.266 kg)  BMI 40.17 kg/m2  General:  alert, cooperative and appears stated age   Breasts:  inspection negative, no nipple discharge or bleeding, no masses or nodularity palpable  Lungs: clear to auscultation bilaterally  Heart:  regular rate and rhythm, S1, S2 normal, no murmur, click, rub or gallop  Abdomen: soft, non-tender; bowel sounds normal; no masses,  no organomegaly   Vulva:  normal  Vagina: normal vagina, no discharge,  exudate, lesion, or erythema; healed well  Cervix:  no cervical motion tenderness  Corpus: normal size, contour, position, consistency, mobility, non-tender  Adnexa:  normal adnexa  Rectal Exam: Not performed.    Assessment:     Normal postpartum exam. Pap smear done at today's visit.  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 Contraception: condoms; completed Diplomatic Services operational officer for Mirena today.  Explained the $220 insertion fee needed at time of procedure.   2. Pap smear to lab 3. Follow up in: 2 weeks for pregnancy test and pending Mirena approval or as needed.

## 2013-06-08 ENCOUNTER — Encounter (HOSPITAL_COMMUNITY): Payer: Self-pay | Admitting: *Deleted

## 2013-06-08 ENCOUNTER — Emergency Department (HOSPITAL_COMMUNITY)
Admission: EM | Admit: 2013-06-08 | Discharge: 2013-06-08 | Disposition: A | Discharge status: Home / Self Care | Payer: Medicaid Other | Attending: Emergency Medicine | Admitting: Emergency Medicine

## 2013-06-08 ENCOUNTER — Emergency Department (HOSPITAL_COMMUNITY): Payer: Medicaid Other

## 2013-06-08 DIAGNOSIS — Z792 Long term (current) use of antibiotics: Secondary | ICD-10-CM | POA: Insufficient documentation

## 2013-06-08 DIAGNOSIS — Y9389 Activity, other specified: Secondary | ICD-10-CM | POA: Insufficient documentation

## 2013-06-08 DIAGNOSIS — S91309A Unspecified open wound, unspecified foot, initial encounter: Secondary | ICD-10-CM | POA: Insufficient documentation

## 2013-06-08 DIAGNOSIS — S91331A Puncture wound without foreign body, right foot, initial encounter: Secondary | ICD-10-CM

## 2013-06-08 DIAGNOSIS — W268XXA Contact with other sharp object(s), not elsewhere classified, initial encounter: Secondary | ICD-10-CM | POA: Insufficient documentation

## 2013-06-08 DIAGNOSIS — Y929 Unspecified place or not applicable: Secondary | ICD-10-CM | POA: Insufficient documentation

## 2013-06-08 DIAGNOSIS — Z87891 Personal history of nicotine dependence: Secondary | ICD-10-CM | POA: Insufficient documentation

## 2013-06-08 MED ORDER — AMOXICILLIN-POT CLAVULANATE 875-125 MG PO TABS: 1.0000 | ORAL_TABLET | Freq: Two times a day (BID) | ORAL | Status: DC

## 2013-06-08 MED ORDER — TETANUS-DIPHTH-ACELL PERTUSSIS 5-2.5-18.5 LF-MCG/0.5 IM SUSP: 0.5000 mL | Freq: Once | INTRAMUSCULAR | Status: DC

## 2013-06-08 NOTE — ED Notes (Signed)
PT stepped on old nail around 1430.  No active bleeding.  Can't remember when she had tetanus.

## 2013-06-08 NOTE — ED Notes (Signed)
DSD applied to right foot.

## 2013-06-08 NOTE — ED Notes (Signed)
Pt states she doesn't remember when she had her last tetanus. However, patient has an encounter from [facility] on 10/13/12 that states she received a TDaP booster. PA Notified.

## 2013-06-08 NOTE — ED Provider Notes (Signed)
CSN: [id number]     Arrival date & time 06/08/13  1844 History  This chart was scribed for non-physician practitioner Dierdre Forth, PA-C working with Candyce Churn, MD by Danella Maiers, ED Scribe. This patient was seen in room TR10C/TR10C and the patient's care was started at 7:48 PM.   Chief Complaint  Patient presents with  . Foot Injury   The history is provided by the patient. No language interpreter was used.   HPI Comments: Janice Chase is a 32 y.o. female who presents to the Emergency Department complaining of right foot pain after stepping on an old nail around 2:30pm that punctured through her sandals. She was able to remove all of the nail immediately. She denies bleeding. Her last tetanus vaccine was less than a year ago in Feb 2014 at [facility]. She has no medical problems. She is not on any medication currently. She does not have a PCP.  Nothing makes her pain better or worse. She did not attempt to take any over-the-counter medications for the pain.  She did not clean the wound.  Past Medical History  Diagnosis Date  . No pertinent past medical history   . Abnormal Pap smear   . H/O cesarean section   . Hx successful VBAC (vaginal birth after cesarean), currently pregnant    Past Surgical History  Procedure Laterality Date  . Cesarean section     Family History  Problem Relation Age of Onset  . Other Neg Hx   . Diabetes Father    History  Substance Use Topics  . Smoking status: Former Games developer  . Smokeless tobacco: Former Neurosurgeon    Quit date: 04/28/2005  . Alcohol Use: No     Comment: Used cocaine 7 years ago   OB History   Grav Para Term Preterm Abortions TAB SAB Ect Mult Living   4 4 1 3      4      Review of Systems  Constitutional: Negative for fever, diaphoresis, appetite change, fatigue and unexpected weight change.  HENT: Negative for mouth sores and neck stiffness.   Eyes: Negative for visual disturbance.  Respiratory: Negative for  cough, chest tightness, shortness of breath and wheezing.   Cardiovascular: Negative for chest pain.  Gastrointestinal: Negative for nausea, vomiting, abdominal pain, diarrhea and constipation.  Endocrine: Negative for polydipsia, polyphagia and polyuria.  Genitourinary: Negative for dysuria, urgency, frequency and hematuria.  Musculoskeletal: Negative for back pain.  Skin: Positive for wound. Negative for rash.  Allergic/Immunologic: Negative for immunocompromised state.  Neurological: Negative for syncope, light-headedness and headaches.  Hematological: Does not bruise/bleed easily.  Psychiatric/Behavioral: Negative for sleep disturbance. The patient is not nervous/anxious.     Allergies  Review of patient's allergies indicates no known allergies.  Home Medications   Current Outpatient Rx  Name  Route  Sig  Dispense  Refill  . amoxicillin-clavulanate (AUGMENTIN) 875-125 MG per tablet   Oral   Take 1 tablet by mouth 2 (two) times daily. One po bid x 7 days   14 tablet   0    BP 138/118  Pulse 110  Temp(Src) 99.7 F (37.6 C) (Oral)  Resp 20  SpO2 99%  LMP 05/12/2013 Physical Exam  Nursing note and vitals reviewed. Constitutional: She appears well-developed and well-nourished. No distress.  Awake, alert, nontoxic appearance  HENT:  Head: Normocephalic and atraumatic.  Mouth/Throat: Oropharynx is clear and moist. No oropharyngeal exudate.  Eyes: Conjunctivae are normal. No scleral icterus.  Neck: Normal  range of motion. Neck supple.  Cardiovascular: Normal rate, regular rhythm, S1 normal, S2 normal, normal heart sounds and intact distal pulses.   No murmur heard. Pulses:      Dorsalis pedis pulses are 2+ on the right side, and 2+ on the left side.       Posterior tibial pulses are 2+ on the right side, and 2+ on the left side.  Capillary refill less than 3  Pulmonary/Chest: Effort normal and breath sounds normal. No respiratory distress. She has no wheezes.  Abdominal:  Soft. Bowel sounds are normal. She exhibits no mass. There is no tenderness. There is no rebound and no guarding.  Musculoskeletal: Normal range of motion. She exhibits no edema.  Full range of motion of all toes, right foot and right ankle  Neurological: She is alert.  Speech is clear and goal oriented Moves extremities without ataxia Sensation intact to dull and sharp in the right foot Strength 5 out of 5 in the right lower extremity  Skin: Skin is warm and dry. She is not diaphoretic.  Puncture wound noted to the distal portion of the sole of the right foot; bleeding controlled No surrounding erythema or induration.    Psychiatric: She has a normal mood and affect.    ED Course  Procedures (including critical care time) Medications - No data to display DIAGNOSTIC STUDIES: Oxygen Saturation is 99% on RA, normal by my interpretation.    COORDINATION OF CARE: 9:33 PM- Discussed treatment plan with pt which includes PCP referral and treatment with antibiotics and pt agrees to plan.    Labs Review Labs Reviewed - No data to display Imaging Review Dg Foot Complete Right  06/08/2013   CLINICAL DATA:  Stepped on nail. Foot pain. Puncture wound to plantar surface of foot.  EXAM: RIGHT FOOT COMPLETE - 3+ VIEW  COMPARISON:  None.  FINDINGS: There is no evidence of fracture or dislocation. No evidence of bone destruction or osteolysis. Small plantar calcaneal spur incidentally noted. Soft tissues are unremarkable. No evidence of soft tissue gas or radiopaque foreign body.  IMPRESSION: Negative.   Electronically Signed   By: Myles Rosenthal M.D.   On: 06/08/2013 21:00    MDM   1. Puncture wound of foot, right, initial encounter    Janice Chase presents with puncture wound from a rusty nail.  Tetanus up-to-date.  X-ray without evidence of retained foreign body.  I personally reviewed the imaging tests through PACS system.  I reviewed available ER/hospitalization records through the EMR. Wound  irrigated with saline and syringe under anesthesia,  No palpable foreign bodies.  Will d/c home with abx for coverage for pseudomonas.  Patient is alert, oriented, nontoxic, nonseptic appearing.  Close followup with the [facility].  It has been determined that no acute conditions requiring further emergency intervention are present at this time. The patient/guardian have been advised of the diagnosis and plan. We have discussed signs and symptoms that warrant return to the ED, such as changes or worsening in symptoms.   Vital signs are stable at discharge.   BP 138/118  Pulse 110  Temp(Src) 99.7 F (37.6 C) (Oral)  Resp 20  SpO2 99%  LMP 05/12/2013  Patient/guardian has voiced understanding and agreed to follow-up with the PCP or specialist.    I personally performed the services described in this documentation, which was scribed in my presence. The recorded information has been reviewed and is accurate.      Dahlia Client Chancy Smigiel,  PA-C 06/08/13 2218

## 2013-06-09 NOTE — ED Provider Notes (Signed)
Medical screening examination/treatment/procedure(s) were performed by non-physician practitioner and as supervising physician I was immediately available for consultation/collaboration.   Candyce Churn, MD 06/09/13 1210

## 2013-09-07 NOTE — L&D Delivery Note (Signed)
Delivery Note At 10:05 AM a viable and healthy female was delivered via Vaginal, Spontaneous Delivery (Presentation: Left Occiput Anterior).  APGAR: 9, 9; weight 7 lb 7.6 oz.   Placenta status: Intact, Spontaneous.  Cord: 3 vessels with the following complications: None.  Cord pH: NA  Anesthesia: None  Episiotomy: None Lacerations: 1st degree, hemostatic Suture Repair: NA Est. Blood Loss (mL): 100  Mom to postpartum.  Baby to Couplet care / Skin to Skin. Placenta to: BS Feeding: Br/Bo Circ: No Contraception: Had planned BTL, btu need to discuss at [facility] visit. Plans no future childbearing.   Janice Chase 05/24/2014, 11:02 AM

## 2013-12-11 ENCOUNTER — Other Ambulatory Visit (HOSPITAL_COMMUNITY): Payer: Self-pay | Admitting: Nurse Practitioner

## 2013-12-11 DIAGNOSIS — Z3689 Encounter for other specified antenatal screening: Secondary | ICD-10-CM

## 2013-12-11 LAB — OB RESULTS CONSOLE PLATELET COUNT: Platelets: 318 10*3/uL

## 2013-12-11 LAB — OB RESULTS CONSOLE HGB/HCT, BLOOD
HCT: 38 %
HEMOGLOBIN: 12.8 g/dL

## 2013-12-11 LAB — OB RESULTS CONSOLE RPR: RPR: NONREACTIVE

## 2013-12-11 LAB — OB RESULTS CONSOLE GC/CHLAMYDIA
Chlamydia: NEGATIVE
Gonorrhea: NEGATIVE

## 2013-12-11 LAB — GLUCOSE TOLERANCE, 1 HOUR: GLUCOSE 1 HOUR GTT: 68

## 2013-12-11 LAB — OB RESULTS CONSOLE ANTIBODY SCREEN: ANTIBODY SCREEN: NEGATIVE

## 2013-12-11 LAB — URINE CULTURE: Urine Culture, OB: NEGATIVE

## 2013-12-11 LAB — OB RESULTS CONSOLE ABO/RH: RH TYPE: POSITIVE

## 2013-12-11 LAB — OB RESULTS CONSOLE RUBELLA ANTIBODY, IGM: RUBELLA: IMMUNE

## 2013-12-11 LAB — OB RESULTS CONSOLE HIV ANTIBODY (ROUTINE TESTING): HIV: NONREACTIVE

## 2013-12-11 LAB — OB RESULTS CONSOLE HEPATITIS B SURFACE ANTIGEN: HEP B S AG: NEGATIVE

## 2013-12-13 IMAGING — CR DG FOOT COMPLETE 3+V*R*
3 series · 3 of 3 positions shown · non-contrast
Comparison: None.

CLINICAL DATA: Stepped on nail. Foot pain. Puncture wound to
plantar surface of foot.

EXAM:
RIGHT FOOT COMPLETE - 3+ VIEW

[x foot ap right]
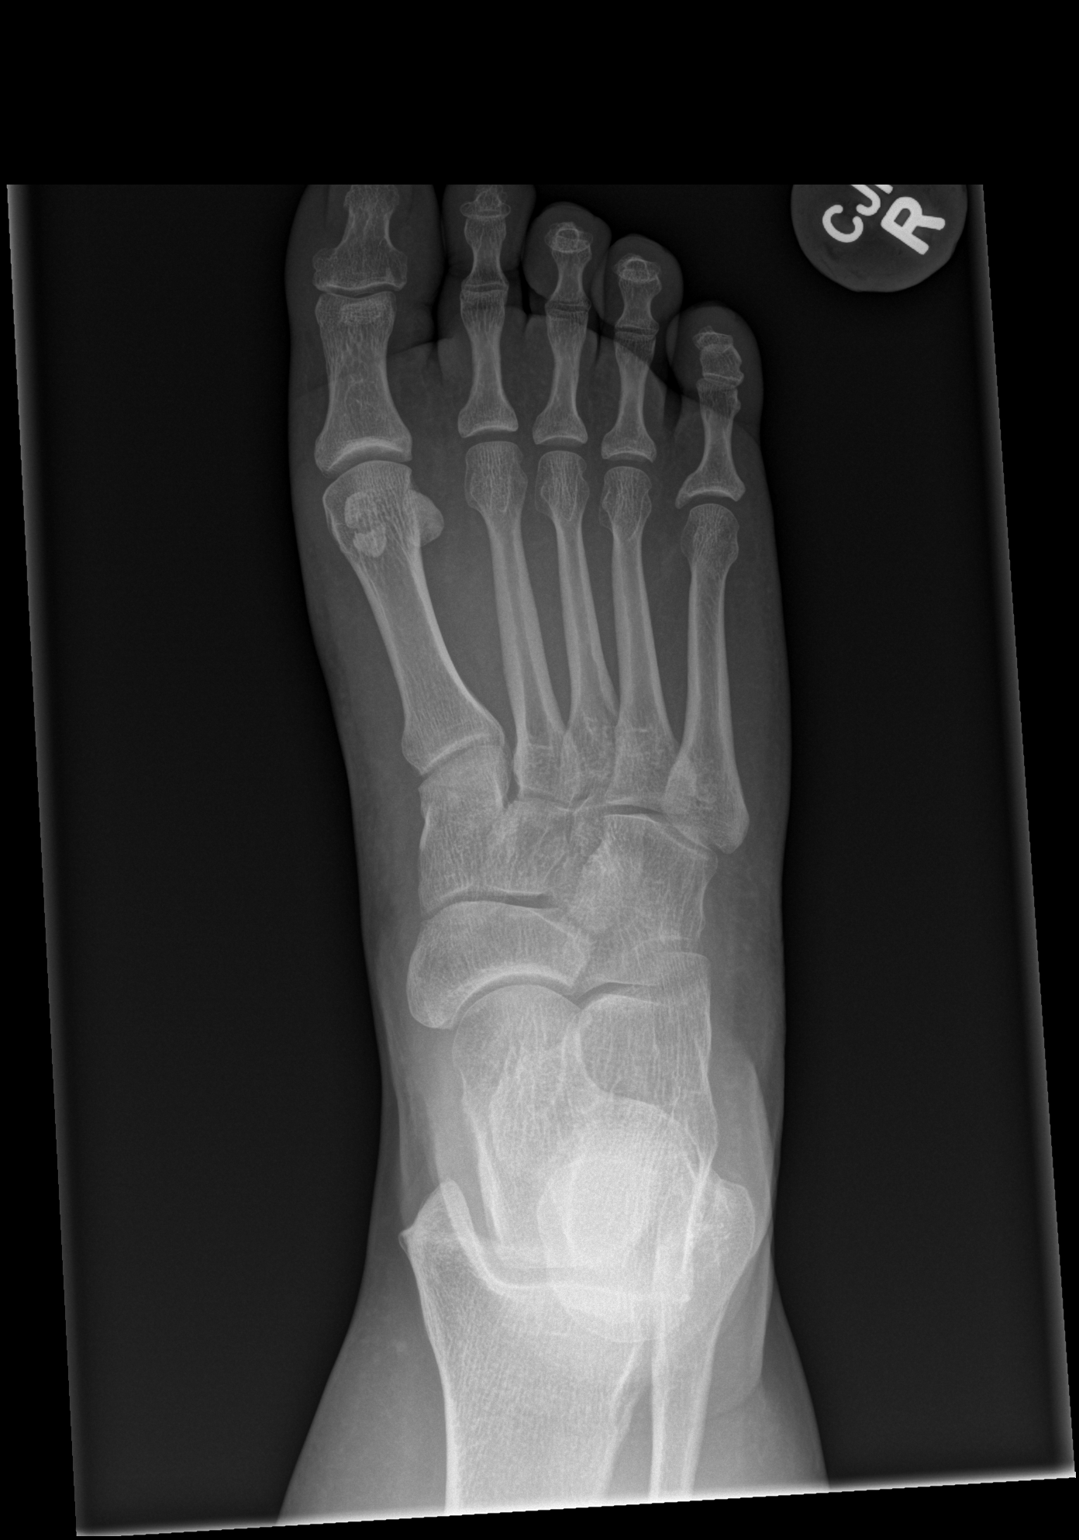

[x foot obl right]
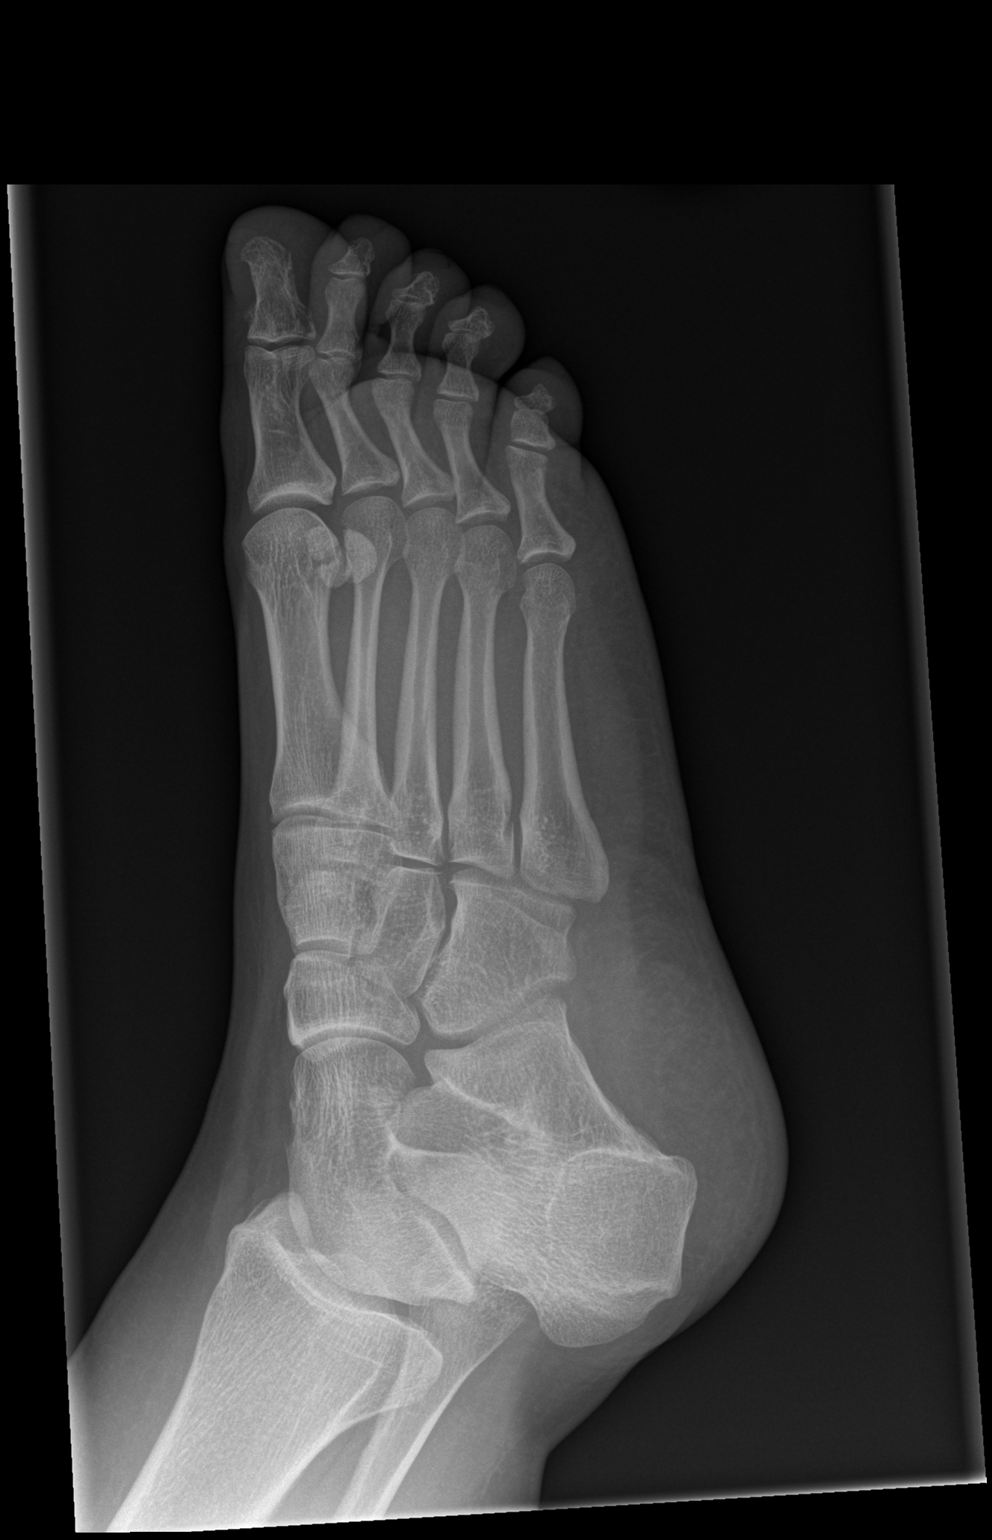

[x foot lat right]
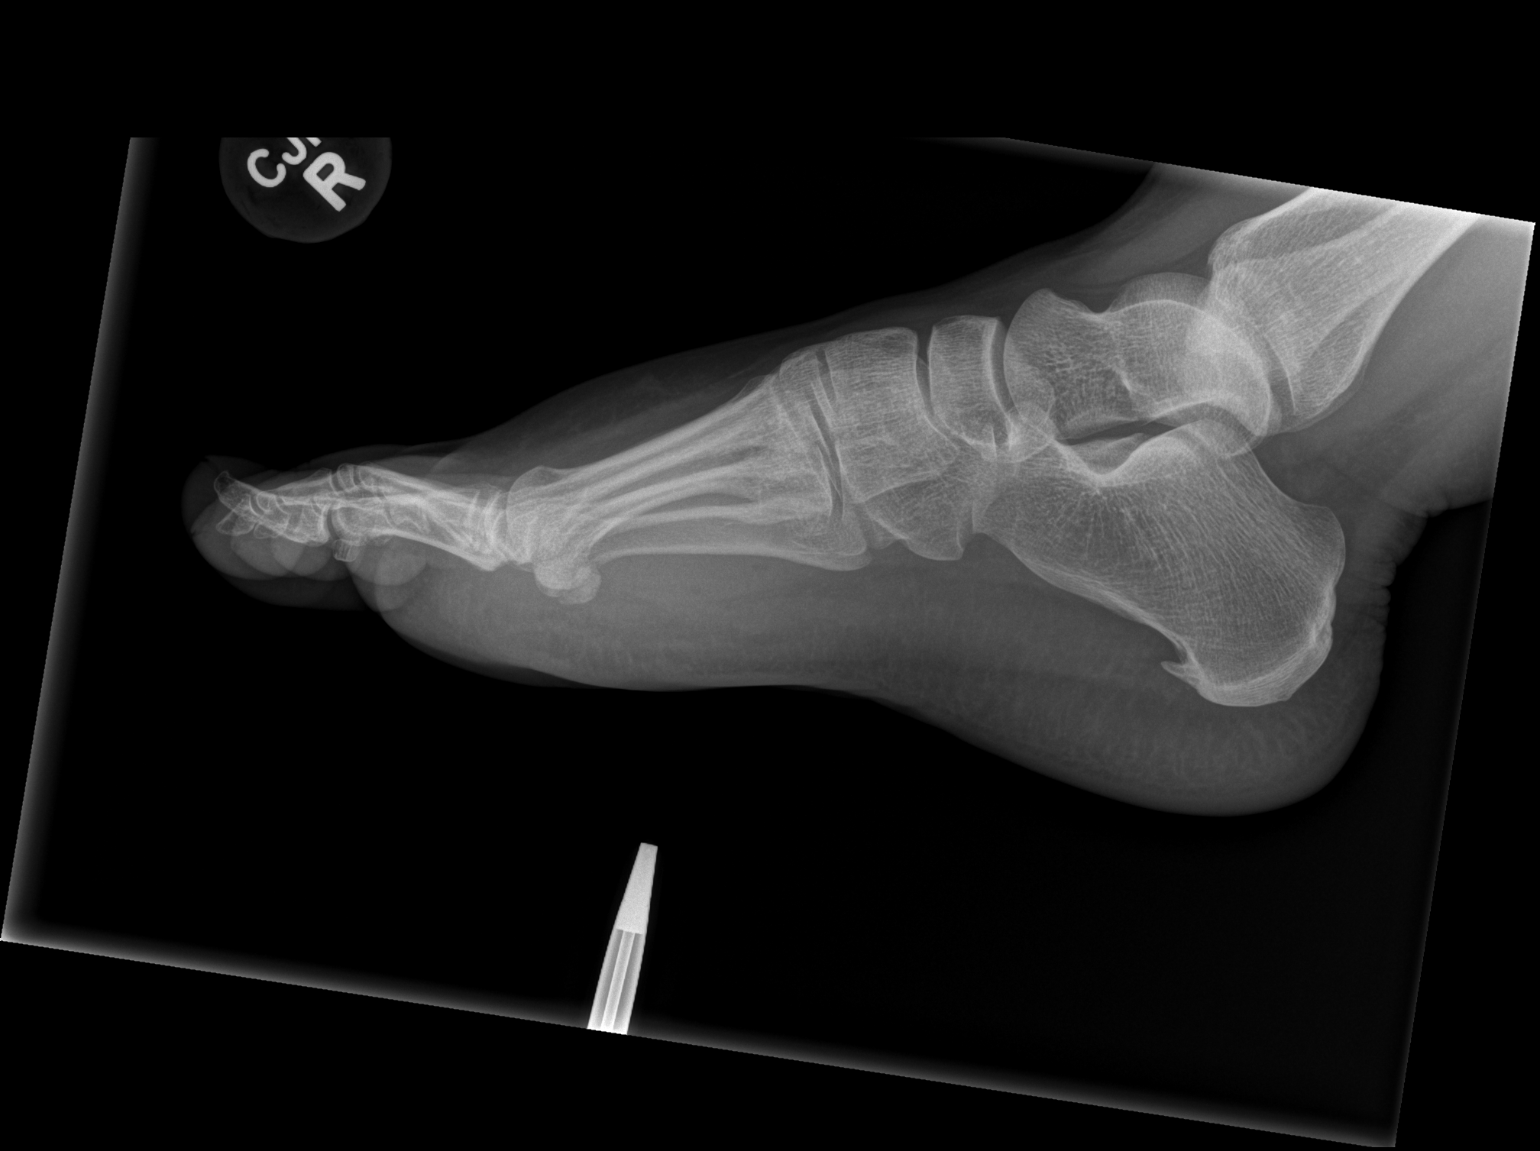

[3 of 3 positions shown; findings below may reference images not displayed]

FINDINGS: There is no evidence of fracture or dislocation. No evidence of bone
destruction or osteolysis. Small plantar calcaneal spur incidentally
noted. Soft tissues are unremarkable. No evidence of soft tissue gas
or radiopaque foreign body.
IMPRESSION: Negative.

## 2013-12-25 ENCOUNTER — Encounter: Payer: Self-pay | Admitting: General Practice

## 2013-12-25 ENCOUNTER — Encounter: Payer: Self-pay | Admitting: *Deleted

## 2013-12-28 ENCOUNTER — Encounter: Payer: Self-pay | Admitting: Obstetrics & Gynecology

## 2014-01-15 ENCOUNTER — Ambulatory Visit (HOSPITAL_COMMUNITY)
Admission: RE | Admit: 2014-01-15 | Discharge: 2014-01-15 | Disposition: A | Discharge status: Home / Self Care | Payer: Medicaid Other | Source: Ambulatory Visit | Attending: Nurse Practitioner | Admitting: Nurse Practitioner

## 2014-01-15 DIAGNOSIS — Z3689 Encounter for other specified antenatal screening: Secondary | ICD-10-CM | POA: Insufficient documentation

## 2014-01-16 ENCOUNTER — Inpatient Hospital Stay (HOSPITAL_COMMUNITY)
Admission: AD | Admit: 2014-01-16 | Discharge: 2014-01-17 | Disposition: A | Discharge status: Home / Self Care | Payer: Medicaid Other | Source: Ambulatory Visit | Attending: Obstetrics & Gynecology | Admitting: Obstetrics & Gynecology

## 2014-01-16 ENCOUNTER — Inpatient Hospital Stay (HOSPITAL_COMMUNITY): Payer: Medicaid Other

## 2014-01-16 ENCOUNTER — Encounter (HOSPITAL_COMMUNITY): Payer: Self-pay

## 2014-01-16 DIAGNOSIS — M545 Low back pain, unspecified: Secondary | ICD-10-CM | POA: Insufficient documentation

## 2014-01-16 DIAGNOSIS — M5442 Lumbago with sciatica, left side: Secondary | ICD-10-CM

## 2014-01-16 DIAGNOSIS — O26899 Other specified pregnancy related conditions, unspecified trimester: Secondary | ICD-10-CM

## 2014-01-16 DIAGNOSIS — E860 Dehydration: Secondary | ICD-10-CM | POA: Insufficient documentation

## 2014-01-16 DIAGNOSIS — O34219 Maternal care for unspecified type scar from previous cesarean delivery: Secondary | ICD-10-CM | POA: Insufficient documentation

## 2014-01-16 DIAGNOSIS — K219 Gastro-esophageal reflux disease without esophagitis: Secondary | ICD-10-CM

## 2014-01-16 DIAGNOSIS — Z87891 Personal history of nicotine dependence: Secondary | ICD-10-CM | POA: Insufficient documentation

## 2014-01-16 DIAGNOSIS — M259 Joint disorder, unspecified: Secondary | ICD-10-CM | POA: Insufficient documentation

## 2014-01-16 DIAGNOSIS — R109 Unspecified abdominal pain: Secondary | ICD-10-CM | POA: Insufficient documentation

## 2014-01-16 DIAGNOSIS — M899 Disorder of bone, unspecified: Secondary | ICD-10-CM | POA: Insufficient documentation

## 2014-01-16 DIAGNOSIS — O99891 Other specified diseases and conditions complicating pregnancy: Secondary | ICD-10-CM | POA: Insufficient documentation

## 2014-01-16 DIAGNOSIS — M5430 Sciatica, unspecified side: Secondary | ICD-10-CM | POA: Insufficient documentation

## 2014-01-16 DIAGNOSIS — O211 Hyperemesis gravidarum with metabolic disturbance: Secondary | ICD-10-CM | POA: Insufficient documentation

## 2014-01-16 DIAGNOSIS — O9989 Other specified diseases and conditions complicating pregnancy, childbirth and the puerperium: Secondary | ICD-10-CM

## 2014-01-16 LAB — URINALYSIS, ROUTINE W REFLEX MICROSCOPIC
Bilirubin Urine: NEGATIVE
GLUCOSE, UA: NEGATIVE mg/dL
HGB URINE DIPSTICK: NEGATIVE
Ketones, ur: >80 mg/dL — AB
LEUKOCYTES UA: NEGATIVE
Nitrite: NEGATIVE
PH: 6 (ref 5.0–8.0)
PROTEIN: NEGATIVE mg/dL
Urobilinogen, UA: 0.2 mg/dL (ref 0.0–1.0)

## 2014-01-16 LAB — WET PREP, GENITAL
Trich, Wet Prep: NONE SEEN
Yeast Wet Prep HPF POC: NONE SEEN

## 2014-01-16 MED ORDER — DEXTROSE 5 % IN LACTATED RINGERS IV BOLUS
1000.0000 mL | Freq: Once | INTRAVENOUS | Status: AC
  Administered 2014-01-16: 1000 mL via INTRAVENOUS

## 2014-01-16 MED ORDER — FAMOTIDINE IN NACL 20-0.9 MG/50ML-% IV SOLN
20.0000 mg | INTRAVENOUS | Status: AC
  Administered 2014-01-16: 20 mg via INTRAVENOUS
  Filled 2014-01-16: qty 50

## 2014-01-16 MED ORDER — CYCLOBENZAPRINE HCL 10 MG PO TABS
10.0000 mg | ORAL_TABLET | Freq: Once | ORAL | Status: AC
  Administered 2014-01-16: 10 mg via ORAL
  Filled 2014-01-16: qty 1

## 2014-01-16 MED ORDER — ACETAMINOPHEN 325 MG PO TABS
650.0000 mg | ORAL_TABLET | Freq: Once | ORAL | Status: AC
Start: 2014-01-16 — End: 2014-01-16
  Administered 2014-01-16: 650 mg via ORAL
  Filled 2014-01-16: qty 2

## 2014-01-16 NOTE — MAU Note (Signed)
Left lower back pain that wraps around to LLQ pain; constant since this morning and getting stronger throughout the day. Denies vaginal bleeding/dicharge/LOF. Positive fetal movement. Left leg pain with the back & abdominal pain.

## 2014-01-16 NOTE — MAU Note (Addendum)
Pt. States pain started this am and has gotten worse. Pain is located in the LLQ and lower left back that goes down her left leg.  Denies any bleeding or discharge. Pt. States she has been feeling baby move today. Pt. Has not seen a doctor during this pregnancy. Pt. Has her first appointment Thursday May 14th. Pt. C[facility].

## 2014-01-16 NOTE — MAU Provider Note (Signed)
Chief Complaint:  Back Pain  @MAUPATCONTACT @  HPI: Janice Chase is a 33 y.o. [id number] at [redacted]w[redacted]d who presents to maternity admissions reporting lower back pain.  Patient reports that Left sided low back and abdominal pain first started this morning (about 10:00am) gradually started following eating this morning, tolerated pain initially then it started to get worse, then went out to the store and progressively getting worse about 2-3pm. Located in LLQ and left lower back, with radiation down left leg. Described as constant, pressure "like having a baby", denies sharp shooting pains, no numbness or tingling, admits to weakness in left leg. States that pain initially improved by sitting and laying down, worse with walking, now difficult to find comfortable position. Also reports L-arm tingling and weakness yesterday, since resolved.  Additional history of persistent morning sickness, states "can't eat or drink anything or else I vomit" - x 1 month, eating only small amount. Has not tried any medications for this other than OTC Tums for "heartburn" with some relief.  Admits good fetal movement. Admits to HA (x1 month with hx nosebleeds), nausea, heartburn, fatigue, burning with intercourse Denies contractions, leakage of fluid or vaginal bleeding. No hematuria, dysuria, fever/chills.  Additionally, patient went to [facility] for ultrasound yesterday to determine baby's gender.  Pregnancy Course: PNC is at [facility] (has not yet had initial prenatal appointment - was scheduled for 01/18/14), dating by LMP. Significant hx septate uterus.  Prior Pregnancy Hx: - first 3 pregnancies significant for pre-term 1. 36 wk - C/S d/t breech 2. 36 wk VBAC 3. 27 wk SVD 4. Term 38 wk VBAC  Past Medical History: Past Medical History  Diagnosis Date  . No pertinent past medical history   . Abnormal Pap smear   . H/O cesarean section   . Hx successful VBAC (vaginal birth after cesarean), currently pregnant      Past obstetric history: OB History  Gravida Para Term Preterm AB SAB TAB Ectopic Multiple Living  5 4 1 3      4     # Outcome Date GA Lbr Len/2nd Weight Sex Delivery Anes PTL Lv  5 CUR           4 TRM 11/10/12 [redacted]w[redacted]d 10:56 / 00:03 3.232 kg (7 lb 2 oz) F VBAC None  Y     Comments: None  3 PRE 02/21/09 [redacted]w[redacted]d  1.361 kg (3 lb) F SVD EPI Y Y  2 PRE 07/07/07 [redacted]w[redacted]d   M VBAC None Y Y  1 PRE 11/14/96 [redacted]w[redacted]d   M LTCS  Y      Comments: baby was breech      Past Surgical History: Past Surgical History  Procedure Laterality Date  . Cesarean section      Family History: Family History  Problem Relation Age of Onset  . Other Neg Hx   . Diabetes Father     Social History: History  Substance Use Topics  . Smoking status: Former Games developer  . Smokeless tobacco: Former Neurosurgeon    Quit date: 04/28/2005  . Alcohol Use: No     Comment: Used cocaine 7 years ago    Allergies: No Known Allergies  Meds:  Prescriptions prior to admission  Medication Sig Dispense Refill  . Prenatal Vit-Fe Fumarate-FA (PRENATAL MULTIVITAMIN) TABS tablet Take 1 tablet by mouth daily at 12 noon.        ROS: Pertinent findings in history of present illness.  Physical Exam  Blood pressure 95/38, pulse 86, temperature 97.9 F (  36.6 C), temperature source Oral, resp. rate 22, last menstrual period 08/25/2013, SpO2 100.00%. GENERAL: Obese, well-appearing but uncomfortable d/t back pain, cooperative, NAD HEENT: NCAT, PERRL, EOMI, mild dry MM / tongue HEART: RRR, no murmurs RESP: CTAB, nml effort ABDOMEN: Obese, soft, mild TTP LLQ and epigastric region. Negative McBurney's and Murphy's. Gravid appropriate for gestational age MSK: Left low back lumbar / gluteal region with muscle hypertonicity and moderate TTP, questionable +L-CVAT (reproduce same LBP), Right SLR negative, Left SLR (positive) with reproducing symptoms with LBP radiating down left leg (without shooting pain, tingling, or numbness), note dorsiflexion  without worsening.  EXTREMITIES: b/l LE non-tender, no edema.  NEURO: alert and oriented SPECULUM EXAM: NEFG, increased white discharge, no pooling, no blood in vaginal vault, cervix without lesions. Bi-manual exam with discomfort but no CMT, no adnexal masses b/l Dilation: Closed Effacement (%): Thick Exam by:: Trinna PostAlex , Resident.  FHT:  Doppler 150s Contractions: none   Labs: Results for orders placed during the hospital encounter of 01/16/14 (from the past 24 hour(s))  URINALYSIS, ROUTINE W REFLEX MICROSCOPIC     Status: Abnormal   Collection Time    01/16/14  7:46 PM      Result Value Ref Range   Color, Urine YELLOW  YELLOW   APPearance CLEAR  CLEAR   Specific Gravity, Urine >1.030 (*) 1.005 - 1.030   pH 6.0  5.0 - 8.0   Glucose, UA NEGATIVE  NEGATIVE mg/dL   Hgb urine dipstick NEGATIVE  NEGATIVE   Bilirubin Urine NEGATIVE  NEGATIVE   Ketones, ur >80 (*) NEGATIVE mg/dL   Protein, ur NEGATIVE  NEGATIVE mg/dL   Urobilinogen, UA 0.2  0.0 - 1.0 mg/dL   Nitrite NEGATIVE  NEGATIVE   Leukocytes, UA NEGATIVE  NEGATIVE  WET PREP, GENITAL     Status: Abnormal   Collection Time    01/16/14  9:00 PM      Result Value Ref Range   Yeast Wet Prep HPF POC NONE SEEN  NONE SEEN   Trich, Wet Prep NONE SEEN  NONE SEEN   Clue Cells Wet Prep HPF POC FEW (*) NONE SEEN   WBC, Wet Prep HPF POC MANY (*) NONE SEEN    Imaging:  Limited OB ultrasound with preliminary result cervical length 3.5 cm  MAU Course:   Assessment: 1. Acute left-sided back pain with sciatica   2. Pregnancy related low back pain, antepartum   3. Mild dehydration   4. GERD (gastroesophageal reflux disease)     Janice Chase is a 33 y.o. [phone] at 2657w4d by LMP, presented to MAU for evaluation of acute worsening Left LBP with radiation down LLE, associated LLQ abd pain. Progressive worsening x 1 day, clinically consistent with Left sided low back muscle spasm with associated sciatica, secondary to pregnancy.  Left-abdominal pain likely related to MSK pain (ligamentous), unlikely appendicitis (afebrile, no RLQ pain). No clinical evidence of contractions, no LOF or VB, FHT reassuring 150s, SVE closed / thick, unlikely pre-term labor.  Additionally, patient no c/o vaginal discharge but +burning with sex, wet prep collected (+few clue cells, no odor), consider treatment with Flagyl (however doubtful clinical diagnosis BV, defer treatment at this time to limit exposure to Flagyl and potential worsening GI side-effects. UA without sign of UTI (considered pyelo, however afebrile, no dysuria, tolerates PO), no Hgb on UA (unlikely nephrolithiasis). UA demonstrates elevated spec grav (>1.030, ketones >80) consistent with dehydration, concerning given persistent morning sickness and poor PO intake. Also, untreated heartburn (  however, unrelated to acute complaint).  UPDATE 1045 - Reported significant improvement in LBP / leg pain s/p Flexeril (down to 2/10 pain). Ordered IV placement, 1L D5-LR for dehydration, and Pepcid 20mg  IV for GERD. Ordered limited OB US for assessment of cervical length (given significant hx pre-term 27 wk delivery)  UPDATE 0100 - US with cervical length 3.5cm. Patient improved s/p IVF, Pepcid, and Flexeril, currently pain significantly improved.  Plan: - Flexeril 10mg  PO x 1 dose - Tylenol 650mg  PO x 1 dose - Pepcid 20mg  IV x 1 dose - Sent new rx for Flexeril, Zantac, Phenergan for PRN management, also may try heating pad, stretching - Encouraged improved PO hydration - Advised on following up with initial Prenatal visit @ [facility] ([facility] - Discharge to home     Follow-up Information   [facility]. (at 10:05am with Dr. Shawnie PonsPratt (initial prenatal care visit, [facility] from [facility]))    Specialty:  Obstetrics and Gynecology   Contact information:   [address] [place] [id number] [phone]        Medication List         cyclobenzaprine 10 MG tablet  Commonly known as:  FLEXERIL  Take 1 tablet (10 mg total) by mouth 3 (three) times daily as needed for muscle spasms.     prenatal multivitamin Tabs tablet  Take 1 tablet by mouth daily at 12 noon.     promethazine 25 MG tablet  Commonly known as:  PHENERGAN  Take 0.5-1 tablets (12.5-25 mg total) by mouth every 6 (six) hours as needed.     ranitidine 150 MG tablet  Commonly known as:  ZANTAC  Take 1 tablet (150 mg total) by mouth 2 (two) times daily.        Saralyn PilarAlexander Karamalegos, DO [facility], PGY-1 01/17/2014 1:14 AM  I have seen this patient and agree with the above resident's note.  LEFTWICH-KIRBY, Devaris Quirk Certified Nurse-Midwife

## 2014-01-17 ENCOUNTER — Inpatient Hospital Stay (HOSPITAL_COMMUNITY): Payer: Medicaid Other

## 2014-01-17 DIAGNOSIS — M5430 Sciatica, unspecified side: Secondary | ICD-10-CM

## 2014-01-17 LAB — GC/CHLAMYDIA PROBE AMP
CT PROBE, AMP APTIMA: NEGATIVE
GC PROBE AMP APTIMA: NEGATIVE

## 2014-01-17 MED ORDER — RANITIDINE HCL 150 MG PO TABS: 150.0000 mg | ORAL_TABLET | Freq: Two times a day (BID) | ORAL | Status: DC

## 2014-01-17 MED ORDER — PROMETHAZINE HCL 25 MG PO TABS: 12.5000 mg | ORAL_TABLET | Freq: Four times a day (QID) | ORAL | Status: DC | PRN

## 2014-01-17 MED ORDER — CYCLOBENZAPRINE HCL 10 MG PO TABS: 10.0000 mg | ORAL_TABLET | Freq: Three times a day (TID) | ORAL | Status: DC | PRN

## 2014-01-17 NOTE — Discharge Instructions (Signed)
Citica  (Sciatica)  La citica es Chief Technology Officer, debilidad, entumecimiento u hormigueo a lo largo del nervio citico. El nervio comienza en la zona inferior de la espalda y desciende por la parte posterior de cada pierna. Una lesin en los nervios o ciertas enfermedades pueden causar un pellizco o Occupational hygienist presin sobre el nervio citico. Esto causa dolor, debilidad y otras molestias de citica.  CUIDADOS EN EL HOGAR   Slo tome los medicamentos segn le indique el mdico.  Aplique hielo en el rea afectada durante 20 minutos. Repita 3-4 veces al da durante las primeras 48-72 horas. Luego intente aplicar calor de la misma manera.  Haga ejercicios, elongue o realice sus actividades habituales, si no le causan ms dolor.  Concurra a fisioterapia segn lo indicado por su mdico.  Cumpla con las visitas al mdico segn las indicaciones.  No use tacones altos o zapatos que no tengan buen apoyo.  Consiga un colchn firme si su colchn es demasiado blando para disminuir el dolor y Environmental health practitioner. SOLICITE AYUDA DE INMEDIATO SI:   No puede controlar su materia fecal (movimientos intestinales) o el pis orina.  Siente debilidad en la zona inferior de la espalda, bajo vientre (pelvis), en las nalgas (glteos).  Siente irritacin o hinchazn (inflamacin) en la espalda.  Tiene sensacin de ardor al [employer].  El dolor empeora cuando se [place].  El dolor lo despierta al dormir.  Su dolor es ms intenso que en el pasado.  Los sntomas duran ms de 4 semanas.  Pierde peso sin motivo de [place] sbita. ASEGRESE DE QUE:   Comprende estas instrucciones.  Controlar la enfermedad.  Solicitar ayuda de inmediato si no mejora o si empeora. Document Released: 09/26/2010 Document Revised: 02/23/2012 [facility] Patient Information 2014 [place], [place].  Segundo trimestre del Psychiatrist  (Second Trimester of Pregnancy) El segundo trimestre del Psychiatrist se extiende desde la semana 13 hasta la semana 28,  del 4 al 6 mes. En general, es el momento del embarazo en el que se sentir mejor. Su organismo se ha adaptado a Charity fundraiser y comienza a Diplomatic Services operational officer. En general las nuseas matutinas han disminuido o han desaparecido completamente. El segundo trimestre es tambin la poca en la que el feto se desarrolla rpidamente. Hacia el final del sexto mes, el beb mide aproximadamente 9 pulgadas (23 cm) y pesa alrededor de 1  libras (700 g). Es probable que Architectural technologist al beb (dar pataditas) entre las 18 y 20 semanas del Psychiatrist.  CAMBIOS CORPORALES  Su organismo atravesar numerosos cambios durante el Embden. Los cambios varan de una mujer a Educational psychologist.   Seguir [employer]. Notar que la parte baja del abdomen se ensancha.  Podrn aparecer las primeras estras en las caderas, abdomen y [place].  Es posible que sienta cefaleas, que se pueden Paramedic con los medicamentos que su mdico le autorice a Chemical engineer.  Tendr necesidad de orinar con ms frecuencia porque el feto est presionando en la vejiga.  Como consecuencia del Psychiatrist, podr sentir Actor.  Podr estar constipada ya que ciertas hormonas hacen que los msculos que [employer] desechos a travs de los intestinos trabajen ms lentamente.  Pueden aparecer hemorroides o abultarse las venas (venas varicosas).  El dolor de espalda se debe al aumento de peso y a que las hormonas del Management consultant las articulaciones entre los huesos de la pelvis y a la modificacin del peso y a los msculos que soportan el equilibrio.  Sus mamas seguirn desarrollndose y  estarn ms sensibles.  Las Veterinary surgeon y estar sensibles al cepillado y al hilo dental.  Pueden aparecer en el rostro zonas oscuras o manchas ([place], mscara del [place]). Esto desaparece despus del nacimiento del beb.  Es posible que se formeuna lnea oscura desde el ombligo a la zona del pubis (linea nigra). Esto  desaparece despus del nacimiento del beb. QU DEBE ESPERAR EN LAS CONSULTAS PRENATALES  Durante una visita prenatal de rutina:   La pesarn para verificar que usted y el feto se encuentran dentro de los lmites normales.  Le tomarn la presin arterial.  Le medirn el abdomen para verificar el desarrollo del beb.  Escucharn los latidos fetales.  Se evaluarn los resultados de los estudios realizados en visitas anteriores. El mdico le preguntar:   Cmo se siente.  Si siente los movimientos del beb.  Si tiene sntomas anormales, como prdida de lquido, [place], dolores de cabeza intensos o clicos abdominales.  Si tiene [place] duda. Otros estudios que podrn realizarse durante el segundo trimestre son:   Anlisis de sangre para evaluar:  Niveles bajos de hierro (anemia).  Diabetes gestacional (entre las 24 y las 28 [address]).  Anticuerpos Rh.  Anlisis de orina para detectar infecciones, diabetes o protenas en la orina.  Una ecografa para confirmar si el crecimiento y el desarrollo del beb son los [place].  Una amniocentesis para diagnosticar posibles problemas genticos.  Estudios del feto para descartar espina bfida y sndrome de [place]. INSTRUCCIONES PARA EL CUIDADO EN EL HOGAR   Evite fumar, consumir hierbas, beber alcohol y Chemical engineer frmacos que no ne hayan recetado. Estas sustancias qumicas afectan la formacin y el desarrollo del beb.  Siga las indicaciones del profesional con respecto a Adult nurse. Durante el embarazo, hay medicamentos que son seguros y otros no lo son.  Realice actividad fsica slo segn las indicaciones del mdico. Sentir clicos uterinos es el mejor signo para Restaurant manager, fast food actividad fsica.  Contine haciendo comidas regulares y sanas.  Use un sostn que le brinde buen soporte si sus mamas estn sensibles.  No utilice la baera con agua caliente, baos turcos y saunas.  Colquese el cinturn de seguridad  cuando conduzca.  Evite comer carne cruda y el contacto con los utensilios y desperdicios de los gatos. Estos elementos contienen grmenes que pueden causar defectos de nacimiento en el beb.  Tome las vitaminas indicadas para la etapa prenatal.  Pruebe un laxante (si el mdico la autoriza) si tiene constipacin. Consuma ms alimentos ricos en fibra, como vegetales y frutas frescos y cereales enteros. Beba gran cantidad de lquido para mantener la orina de tono claro o amarillo plido.  Tome baos de agua tibia para Primary school teacher o las molestias causadas por las hemorroides. Use una crema para las hemorroides si el mdico la autoriza.  Si tiene venas varicosas, use medias de soporte. Eleve los pies durante 15 minutos, 3 o 4 veces por da. Limite el consumo de sal en su dieta.  Evite levantar objetos pesados, use zapatos de tacones bajos y [place].  Descanse con las piernas elevadas si tiene calambres o dolor de cintura.  Visite a su dentista si no lo ha Occupational hygienist. Use un cepillo de dientes blando para higienizarse los dientes y use suavemente el hilo dental.  Puede continuar su vida sexual siempre que el mdico la autorice.  Concurra a todas las visitas prenatales segn las indicaciones de su mdico. SOLICITE ATENCIN MDICA SI:  Tiene mareos.  Siente clicos leves, presin en la pelvis o dolor persistente en el abdomen.  Tiene nuseas o vmitos o diarrea persistentes.  Brett Fairybserva una secrecin vaginal con mal olor.  Siente dolor al ConocoPhillipsorinar. SOLICITE ATENCIN MDICA DE INMEDIATO SI:   Tiene fiebre.  Pierde lquido por la vagina.  Tiene sangrando o pequeas prdidas vaginales.  Siente dolor intenso o clicos en el abdomen.  Sube o baja de peso rpidamente.  Tiene dificultad para respirar y Geographical information systems officerle duele pecho.  Sbitamente se le hincha el rostro, las manos, los tobillos, los pies o las piernas de Kent Narrowsmanera extrema.  No ha sentido los  movimientos del beb durante Georgianne Fickuna hora.  Siente un dolor de cabeza intenso que no se alivia con medicamentos.  Su visin se modifica. Document Released: 06/03/2005 Document Revised: 04/26/2013 [facility] Patient Information 2014 [place]Care, [place]. Nuseas matinales (Morning Sickness) Se denominan nuseas matinales a las ganas de vomitar (nuseas) durante el Psychiatristembarazo. Esta sensacin puede estar acompaada o no de vmitos. Aparecen por la maana, pero puede ser un problema a lo largo de Union Pacific Corporationtodo el da. Las Ecolabnuseas matinales son ms frecuentes Physicist, medicaldurante el primer trimestre, Biomedical engineerpero pueden continuar durante todo el Bouseembarazo. Aunque son molestas, generalmente no causan ningn dao, excepto que presente vmitos continuos e intensos (hiperemesis gravdica). Este problema requiere un tratamiento ms intenso.  CAUSAS  La causa de las nuseas matinales no se conoce completamente pero parecen estar relacionadas con los cambios hormonales que ocurren durante el Tenahaembarazo. FACTORES DE RIESGO Usted tendr mayor riesgo si:  Sufra de nuseas o vmitos antes de Bur[place]iquedar embarazada.  Tuvo nuseas matinales durante los embarazos previos.  Est embarazada de ms de un beb, por ejemplo mellizos. TRATAMIENTO  No utilice ningn medicamento (prescripto, de venta libre ni hierbas) para este problema sin consultar con su mdico. El mdico tambin podr Camera operatorrecetar o recomendar:  Suplementos de vitamina B6.  Medicamentos para las nauseas  La medicina herbal llamada jengibre. INSTRUCCIONES PARA EL CUIDADO EN EL HOGAR   Tome slo medicamentos de venta libre o recetados, segn las indicaciones del mdico.  Tomar un multivitamnico antes de Scientist, research (physical sciences)quedar embarazada puede prevenir o disminuir la gravedad de las nuseas matinales en la mayora de las mujeres.  Coma un trozo de Cape Verdetostada seca o una cracker sin sal antes de levantarse de la cama por la maana.  Coma 5 o 6 comidas pequeas por da.  Consuma alimentos blandos y secos (arroz,  papas asadas). Los alimentos ricos en hidratos de carbono generalmente ayudan.  No  beba lquidos con las comidas. Tome lquidos Altria Groupentre las comidas.  Evite los alimentos muy grasos o condimentados.  Pdale a otra persona que cocine para usted si Quest Diagnosticsel olor de algn alimento le provoca nuseas o vmitos.  Si tiene ganas de vomitar despus de tomar las vitaminas prenatales, tmelas a la noche o con una colacin.  Tome colaciones de alimentos proteicos entre comidas si siente apetito.  Coma gelatina sin azcar de postre.  Una pulsera de acupresin ( que se utiliza para Research scientist (life sciences)mareos en viajes) puede ser de Nomeutilidad.  La acupuntura puede ayudarla.  No fume.  Consiga un humidificador para Customer service managermantener el aire de su casa libre de [place]olores.  Trate de respirar aire fresco. SOLICITE ATENCIN MDICA SI:   Los remedios caseros no funcionan y Media plannernecesita medicamentos.  Se siente mareada o sufre un desmayo.  Pierde peso. SOLICITE ATENCIN MDICA DE INMEDIATO SI:   Tiene nuseas y vmitos de manera persistente y no puede controlarlos.  Pierde  el conocimiento (se desmaya). Document Released: 12/10/2008 Document Revised: 04/26/2013 [facility] Patient Information 2014 [place]ExitCare, [place].

## 2014-01-18 ENCOUNTER — Encounter: Payer: Self-pay | Admitting: Family Medicine

## 2014-01-18 ENCOUNTER — Other Ambulatory Visit: Payer: Self-pay | Admitting: Family Medicine

## 2014-01-18 ENCOUNTER — Ambulatory Visit (INDEPENDENT_AMBULATORY_CARE_PROVIDER_SITE_OTHER): Payer: Medicaid Other | Admitting: Family Medicine

## 2014-01-18 VITALS — BP 101/68 | HR 96 | Temp 97.3°F | Wt 199.1 lb

## 2014-01-18 DIAGNOSIS — O34219 Maternal care for unspecified type scar from previous cesarean delivery: Secondary | ICD-10-CM | POA: Insufficient documentation

## 2014-01-18 DIAGNOSIS — M5430 Sciatica, unspecified side: Secondary | ICD-10-CM

## 2014-01-18 DIAGNOSIS — O09899 Supervision of other high risk pregnancies, unspecified trimester: Secondary | ICD-10-CM

## 2014-01-18 DIAGNOSIS — Z789 Other specified health status: Secondary | ICD-10-CM | POA: Insufficient documentation

## 2014-01-18 DIAGNOSIS — Z603 Acculturation difficulty: Secondary | ICD-10-CM

## 2014-01-18 DIAGNOSIS — O99210 Obesity complicating pregnancy, unspecified trimester: Secondary | ICD-10-CM

## 2014-01-18 DIAGNOSIS — IMO0002 Reserved for concepts with insufficient information to code with codable children: Secondary | ICD-10-CM

## 2014-01-18 DIAGNOSIS — Z0489 Encounter for examination and observation for other specified reasons: Secondary | ICD-10-CM

## 2014-01-18 DIAGNOSIS — O09299 Supervision of pregnancy with other poor reproductive or obstetric history, unspecified trimester: Secondary | ICD-10-CM

## 2014-01-18 DIAGNOSIS — Z609 Problem related to social environment, unspecified: Secondary | ICD-10-CM

## 2014-01-18 DIAGNOSIS — E669 Obesity, unspecified: Secondary | ICD-10-CM

## 2014-01-18 LAB — POCT URINALYSIS DIP (DEVICE)
Bilirubin Urine: NEGATIVE
Glucose, UA: NEGATIVE mg/dL
Hgb urine dipstick: NEGATIVE
Ketones, ur: NEGATIVE mg/dL
Leukocytes, UA: NEGATIVE
NITRITE: NEGATIVE
PH: 6 (ref 5.0–8.0)
PROTEIN: 30 mg/dL — AB
Specific Gravity, Urine: >=1.03 (ref 1.005–1.030)
Urobilinogen, UA: 1 mg/dL (ref 0.0–1.0)

## 2014-01-18 MED ORDER — HYDROXYPROGESTERONE CAPROATE 250 MG/ML IM OIL: 250.0000 mg | TOPICAL_OIL | Freq: Once | INTRAMUSCULAR | Status: DC

## 2014-01-18 NOTE — MAU Provider Note (Signed)
Attestation of Attending Supervision of Advanced Practitioner (PA/CNM/NP): Evaluation and management procedures were performed by the Advanced Practitioner under my supervision and collaboration.  I have reviewed the Advanced Practitioner's note and chart, and I agree with the management and plan.  Travarus Trudo, MD, FACOG Attending Obstetrician & Gynecologist Faculty Practice, [facility]  

## 2014-01-18 NOTE — Patient Instructions (Signed)
Segundo trimestre del embarazo  (Second Trimester of Pregnancy) El segundo trimestre del embarazo se extiende desde la semana 13 hasta la semana 28, del 4 al 6 mes. En general, es el momento del embarazo en el que se sentir mejor. Su organismo se ha adaptado a estar embarazada y comienza a sentirse fsicamente mejor. En general las nuseas matutinas han disminuido o han desaparecido completamente. El segundo trimestre es tambin la poca en la que el feto se desarrolla rpidamente. Hacia el final del sexto mes, el beb mide aproximadamente 9 pulgadas (23 cm) y pesa alrededor de 1  libras (700 g). Es probable que sienta mover al beb (dar pataditas) entre las 18 y 20 semanas del embarazo.  CAMBIOS CORPORALES  Su organismo atravesar numerosos cambios durante el embarazo. Los cambios varan de una mujer a otra.   Seguir aumentando de peso. Notar que la parte baja del abdomen se ensancha.  Podrn aparecer las primeras estras en las caderas, abdomen y mamas.  Es posible que sienta cefaleas, que se pueden aliviar con los medicamentos que su mdico le autorice a utilizar.  Tendr necesidad de orinar con ms frecuencia porque el feto est presionando en la vejiga.  Como consecuencia del embarazo, podr sentir acidez estomacal continuamente.  Podr estar constipada ya que ciertas hormonas hacen que los msculos que empujan los desechos a travs de los intestinos trabajen ms lentamente.  Pueden aparecer hemorroides o abultarse las venas (venas varicosas).  El dolor de espalda se debe al aumento de peso y a que las hormonas del embarazo relajan las articulaciones entre los huesos de la pelvis y a la modificacin del peso y a los msculos que soportan el equilibrio.  Sus mamas seguirn desarrollndose y estarn ms sensibles.  Las encas pueden sangrar y estar sensibles al cepillado y al hilo dental.  Pueden aparecer en el rostro zonas oscuras o manchas (cloasma, mscara del embarazo). Esto  desaparece despus del nacimiento del beb.  Es posible que se formeuna lnea oscura desde el ombligo a la zona del pubis (linea nigra). Esto desaparece despus del nacimiento del beb. QU DEBE ESPERAR EN LAS CONSULTAS PRENATALES  Durante una visita prenatal de rutina:   La pesarn para verificar que usted y el feto se encuentran dentro de los lmites normales.  Le tomarn la presin arterial.  Le medirn el abdomen para verificar el desarrollo del beb.  Escucharn los latidos fetales.  Se evaluarn los resultados de los estudios realizados en visitas anteriores. El mdico le preguntar:   Cmo se siente.  Si siente los movimientos del beb.  Si tiene sntomas anormales, como prdida de lquido, sangrado, dolores de cabeza intensos o clicos abdominales.  Si tiene alguna duda. Otros estudios que podrn realizarse durante el segundo trimestre son:   Anlisis de sangre para evaluar:  Niveles bajos de hierro (anemia).  Diabetes gestacional (entre las 24 y las 28 semanas).  Anticuerpos Rh.  Anlisis de orina para detectar infecciones, diabetes o protenas en la orina.  Una ecografa para confirmar si el crecimiento y el desarrollo del beb son los adecuados.  Una amniocentesis para diagnosticar posibles problemas genticos.  Estudios del feto para descartar espina bfida y sndrome de [place]. INSTRUCCIONES PARA EL CUIDADO EN EL HOGAR   Evite fumar, consumir hierbas, beber alcohol y utilizar frmacos que no ne hayan recetado. Estas sustancias qumicas afectan la formacin y el desarrollo del beb.  Siga las indicaciones del profesional con respecto a como tomar los medicamentos. Durante el embarazo, hay   medicamentos que son seguros y otros no lo son.  Realice actividad fsica slo segn las indicaciones del mdico. Sentir clicos uterinos es el mejor signo para Restaurant manager, fast fooddetener la actividad fsica.  Contine haciendo comidas regulares y sanas.  Use un sostn que le brinde buen  soporte si sus mamas estn sensibles.  No utilice la baera con agua caliente, baos turcos y saunas.  Colquese el cinturn de seguridad cuando conduzca.  Evite comer carne cruda y el contacto con los utensilios y desperdicios de los gatos. Estos elementos contienen grmenes que pueden causar defectos de nacimiento en el beb.  Tome las vitaminas indicadas para la etapa prenatal.  Pruebe un laxante (si el mdico la autoriza) si tiene constipacin. Consuma ms alimentos ricos en fibra, como vegetales y frutas frescos y cereales enteros. Beba gran cantidad de lquido para mantener la orina de tono claro o amarillo plido.  Tome baos de agua tibia para Primary school teachercalmar el dolor o las molestias causadas por las hemorroides. Use una crema para las hemorroides si el mdico la autoriza.  Si tiene venas varicosas, use medias de soporte. Eleve los pies durante 15 minutos, 3 o 4 veces por da. Limite el consumo de sal en su dieta.  Evite levantar objetos pesados, use zapatos de tacones bajos y Brazilmantenga una buena postura.  Descanse con las piernas elevadas si tiene calambres o dolor de cintura.  Visite a su dentista si no lo ha Occupational hygienisthecho durante el embarazo. Use un cepillo de dientes blando para higienizarse los dientes y use suavemente el hilo dental.  Puede continuar su vida sexual siempre que el mdico la autorice.  Concurra a todas las visitas prenatales segn las indicaciones de su mdico. SOLICITE ATENCIN MDICA SI:   Santa Generaiene mareos.  Siente clicos leves, presin en la pelvis o dolor persistente en el abdomen.  Tiene nuseas o vmitos o diarrea persistentes.  Brett Fairybserva una secrecin vaginal con mal olor.  Siente dolor al ConocoPhillipsorinar. SOLICITE ATENCIN MDICA DE INMEDIATO SI:   Tiene fiebre.  Pierde lquido por la vagina.  Tiene sangrando o pequeas prdidas vaginales.  Siente dolor intenso o clicos en el abdomen.  Sube o baja de peso rpidamente.  Tiene dificultad para respirar y Science writerle duele  pecho.  Sbitamente se le hincha el rostro, las manos, los tobillos, los pies o las piernas de Olamanera extrema.  No ha sentido los movimientos del beb durante Georgianne Fickuna hora.  Siente un dolor de cabeza intenso que no se alivia con medicamentos.  Su visin se modifica. Document Released: 06/03/2005 Document Revised: 04/26/2013 [facility] Patient Information 2014 [place]ExitCare, MarylandLLC.  Eleccin del mtodo anticonceptivo (Contraception Choices) La anticoncepcin (control de la natalidad) es el uso de cualquier mtodo o dispositivo para Location managerevitar el embarazo. A continuacin se indican algunos de esos mtodos. MTODOS HORMONALES   El Implante contraconceptivo consiste en un tubo plstico delgado que contiene la hormona progesterona. No contiene estrgenos. El mdico inserta el tubo en la parte interna del brazo. El tubo puede Geneticist, molecularpermanecer en el lugar durante 3 aos. Despus de los 3 aos debe retirarse. El implante impide que los ovarios liberen vulos (ovulacin), espesa el moco cervical, lo que evita que los espermatozoides ingresen al tero y hace ms delgada la membrana que cubre el interior del tero.  Inyecciones de progesterona sola: las Insurance underwriteradministra el mdico cada 3 meses para Location managerevitar el embarazo. La progesterona sinttica impide que los ovarios liberen vulos. Tambin hacen que el moco cervical se espese y modifique el tejido de recubrimiento interno del  tero. Esto hace ms difcil que los espermatozoides sobrevivan en el tero.  Las pldoras anticonceptivas contienen estrgenos y Education officer, museum. Su funcin es [employer] ovarios liberen vulos (ovulacin). Las hormonas de los anticonceptivos orales hacen que el moco cervical se haga ms espeso, lo que evita que el esperma ingrese al tero. Las pldoras anticonceptivas son recetadas por el mdico.Tambin se utilizan para tratar los perodos menstruales abundantes.  Minipldora: este tipo de pldora anticonceptiva contiene slo hormona progesterona. Deben  tomarse todos los [address] del mes y debe recetarlas el mdico.  El parche de control de natalidad: contiene hormonas similares a las que contienen las pldoras anticonceptivas. Deben cambiarse una vez por semana y se utilizan bajo prescripcin mdica.  Anillo vaginal: contiene hormonas similares a las que contienen las pldoras anticonceptivas. Se deja colocado durante tres semanas, se lo retira durante 1 semana y luego se coloca uno nuevo. La paciente debe sentirse cmoda al insertar y retirar el anillo de la vagina.Es necesaria la prescripcin mdica.  Anticonceptivos de emergencia: son mtodos para evitar un embarazo despus de Neomia Dear relacin sexual sin proteccin. Esta pldora puede tomarse inmediatamente despus de Child psychotherapist sexuales o hasta 5 [place] de haber tenido sexo sin proteccin. Es ms efectiva si se toma poco tiempo despus de la relacin sexual. Los anticonceptivos de emergencia estn disponibles sin prescripcin mdica. Consltelo con su farmacutico. No use los anticonceptivos de emergencia como nico mtodo anticonceptivo. MTODOS DE Lenis Noon   Condn masculino: es una vaina delgada (ltex o goma) que se coloca cubriendo al pene durante el acto sexual. Deri Fuelling con espermicida para aumentar la efectividad.  Condn femenino. Es una funda delicada y blanda que se adapta holgadamente a la vagina antes de las Clinical research associate.  Diafragma: es una barrera de ltex redonda y suave que debe ser recomendado por un profesional. Se inserta en la vagina, junto con un gel espermicida. Debe insertarse antes de Management consultant. Debe dejar el diafragma colocado en la vagina durante 6 a 8 horas despus de la relacin sexual.  Capuchn cervical: es una barrera de ltex o taza plstica redonda y [place] que cubre el cuello del tero y debe ser colocada por un mdico. Puede dejarlo colocado en la vagina hasta 48 horas despus de las Clinical research associate.  Esponja: es una pieza blanda y  circular de espuma de poliuretano. Contiene un espermicida. Se inserta en la vagina despus de mojarla y antes de las [employer].  Espermicidas: son sustancias qumicas que matan o bloquean al esperma y no lo dejan ingresar al cuello del tero y al tero. Vienen en forma de cremas, geles, supositorios, espuma o comprimidos. No es necesario tener Emergency planning/management officer. Se insertan en la vagina con un aplicador antes de Management consultant. El proceso debe repetirse cada vez que tiene relaciones sexuales. ANTICONCEPTIVOS INTRAUTERINOS  Dispositivo intrauterino (DIU) es un dispositivo en forma de T que se coloca en el tero durante el perodo menstrual, para Location manager. Hay dos tipos:  DIU de cobre: este tipo de DIU est recubierto con un alambre de cobre y se inserta dentro del tero. El cobre hace que el tero y las trompas de Falopio produzcan un liquido que [employer] espermatozoides. Puede permanecer colocado durante 10 aos.  DIU con hormona: este tipo de DIU contiene la hormona progestina (progesterona sinttica). La hormona espesa el moco cervical y evita que los espermatozoides ingresen al tero y tambin afina la membrana que cubre el tero para evitar la implantacin del  vulo fertilizado. La hormona debilita o destruye los espermatozoides que ingresan al tero. Puede Geneticist, molecularpermanecer en el lugar durante 3 5 aos, segn el tipo de DIU que se utilice. MTODOS ANTICONCEPTIVOS PERMANENTES  Ligadura de trompas en la mujer: se realiza sellando, atando u obstruyendo quirrgicamente las trompas de Falopio lo que impide que el vulo descienda hacia el tero.  Esterilizacin histeroscpica: Implica la colocacin de un pequeo espiral o la insercin en cada trompa de Falopio. El mdico utiliza una tcnica llamada histeroscopa para Primary school teacherrealizar este procedimiento. El dispositivo produce la formacin de tejido Designer, television/film setcicatrizal. Esto da como resultado una obstruccin permanente de las trompas de Falopio,  de modo que la esperma no pueda fertilizar el vulo. Demora alrededor de 3 meses despus del procedimiento hasta que el conducto se obstruye. Tendr que usar otro mtodo anticonceptivo durante al menos 3 meses.  Esterilizacin masculina: se realiza ligando los conductos por los que pasan los espermatozoides (vasectoma).Esto impide que el esperma ingrese a la vagina durante el acto sexual. Luego del procedimiento, el hombre puede eyacular lquido (semen). MTODOS DE PLANIFICACIN NATURAL  Planificacin familiar natural: consiste en no Management consultanttener relaciones sexuales o usar un mtodo de barrera (condn, Uniondiafragma, capuchn cervical) en los IKON Office Solutionsdas que la mujer podra quedar Shade Gapembarazada.  Mtodo de calendario: consiste en el seguimiento de la duracin de cada ciclo menstrual y la identificacin de los perodos frtiles.  Mtodo de ovulacin: Paramedicconsiste en evitar las relaciones sexuales durante la ovulacin.  Mtodo sintotrmico: Advertising copywriterconsiste en evitar las relaciones sexuales en la poca en la que se est ovulando, utilizando un termmetro y tendiendo en cuenta los sntomas de la ovulacin.  Mtodo postovulacin: Youth workerconsiste en planificar las relaciones sexuales para despus de haber ovulado. Independientemente del tipo o mtodo anticonceptivo que usted elija, es importante que use condones para protegerse contra las infecciones de transmisin sexual (ETS). Hable con su mdico con respecto a qu mtodo anticonceptivo es el ms apropiado para usted. Document Released: 08/24/2005 Document Revised: 04/26/2013 [facility]Exit[facility] Patient Information 2014 [place] LynbrookExitCare, MarylandLLC.  Lactancia materna (Breastfeeding) Decidir Museum/gallery exhibitions officeramamantar es una de las mejores elecciones que puede hacer por usted y su beb. El cambio hormonal durante el Psychiatristembarazo produce el desarrollo del tejido mamario y Lesothoaumenta la cantidad y el tamao de los conductos galactforos. Estas hormonas tambin permiten que las protenas, los azcares y las grasas de la sangre produzcan  la WPS Resourcesleche materna en las glndulas productoras de Laurelvilleleche. Las hormonas impiden que la leche materna sea liberada antes del nacimiento del beb, adems de impulsar el flujo de leche luego del nacimiento. Una vez que ha comenzado a Museum/gallery exhibitions officeramamantar, Conservation officer, naturepensar en el beb, as Immunologistcomo la succin o Theatre managerel llanto, pueden estimular la liberacin de Newhallleche de las glndulas productoras de Konterraleche.  LOS BENEFICIOS DE AMAMANTAR Para el beb  La primera leche (calostro) ayuda al mejor funcionamiento del sistema digestivo del beb.  La leche tiene anticuerpos que ayudan a Radio producerprevenir las infecciones en el beb.  El beb tiene una menor incidencia de asma, alergias y del sndrome de muerte sbita del lactante.  Los nutrientes en la Nardinleche materna son mejores para el beb que la Mentoneleche maternizada y estn preparados exclusivamente para cubrir las necesidades del beb.  La leche materna mejora el desarrollo cerebral del beb.  Es menos probable que el beb desarrolle otras enfermedades, como obesidad infantil, asma o diabetes mellitus de tipo 2. Para usted   La lactancia materna favorece el desarrollo de un vnculo muy especial entre la madre y el beb.  Es conveniente. Siempre est disponible a la temperatura correcta y es [place].  La lactancia materna ayuda a quemar caloras y a perder el peso ganado durante el [place].  Favorece la contraccin del tero al tamao que tena antes del embarazo de manera ms rpida y disminuye el sangrado (loquios) despus del parto.  La lactancia materna contribuye a reducir Nurse, adult de desarrollar diabetes mellitus de tipo 2, osteoporosis o cncer de mama o de ovario en el futuro. SIGNOS DE QUE EL BEB EST HAMBRIENTO Primeros signos de [address] de [place].  Se estira.  Mueve la cabeza de un lado a otro.  Mueve la cabeza y abre la boca cuando se le toca la mejilla o la comisura de la boca (reflejo de bsqueda).  Aumenta las vocalizaciones, tales como  sonidos de succin, se relame los labios, emite arrullos, suspiros, o chirridos.  Mueve la [employer] boca.  Se chupa con ganas los dedos o las manos. Signos tardos de [employer].  Llora de manera intermitente. Signos de [employer] signos de hambre extrema requerirn que lo calme y lo consuele antes de que el beb pueda alimentarse adecuadamente. No espere a que se manifiesten los siguientes signos de hambre extrema para comenzar a Museum/gallery exhibitions officer:   Designer, jewellery.  Llanto intenso y fuerte.   Gritos. INFORMACIN BSICA SOBRE LA LACTANCIA MATERNA Iniciacin de la lactancia materna  Encuentre un lugar cmodo para sentarse o acostarse, con un buen respaldo para el cuello y la espalda.  Coloque una almohada o una manta enrollada debajo del beb para acomodarlo a la altura de la mama (si est sentada). Las almohadas para Museum/gallery exhibitions officer se han diseado especialmente a fin de servir de apoyo para los brazos y el beb [employer].  Asegrese de que el abdomen del beb est frente al suyo.  Masajee suavemente la mama. Con las yemas de los dedos, masajee la pared del pecho hacia el pezn en un movimiento circular. Esto estimula el flujo de [place]. Es posible que Engineer, manufacturing systems este movimiento mientras amamanta si la leche fluye lentamente.  Sostenga la mama con el pulgar por arriba del pezn y los otros 4 dedos por debajo de la mama. Asegrese de que los dedos se encuentren lejos del pezn y de la boca del beb.  Empuje suavemente los labios del beb con el pezn o con el dedo.  Cuando la boca del beb se abra lo suficiente, acrquelo rpidamente a la mama e introduzca todo el pezn y la zona oscura que lo rodea (areola), tanto como sea posible, dentro de la boca del beb.  Debe haber ms areola visible por arriba del labio superior del beb que por debajo del labio inferior.  La lengua del beb debe estar entre la enca inferior y la [place].  Asegrese de que la boca del beb  est en la posicin correcta alrededor del pezn (prendida). Los labios del beb deben crear un sello sobre la mama, doblndose hacia afuera (invertidos).  Es comn que el beb succione durante 2 a 3 minutos para que comience el flujo de [place]. Cmo debe prenderse Es muy importante que le ensee al beb cmo prenderse adecuadamente a la mama. Si el beb no se prende adecuadamente, puede causarle dolor en el pezn y reducir la produccin de [place], y hacer que el beb tenga un escaso aumento de [place]. Adems, si el beb no se prende adecuadamente al pezn, puede tragar [employer]  la alimentacin. Esto puede causarle molestias al beb. Hacer eructar al beb al Pilar Plate de mama puede ayudarlo a liberar el aire. Sin embargo, ensearle al beb cmo prenderse a la mama adecuadamente es la mejor manera de evitar que se sienta molesto por tragar Oceanographer se alimenta. Signos de que el beb se ha prendido adecuadamente al pezn:   Payton Doughty o succiona de modo silencioso, sin causarle dolor.  Se escucha que traga cada 3 o 4 succiones.   Hay movimientos musculares por arriba y por delante de sus odos al Printmaker. Signos de que el beb no se ha prendido Audiological scientist al pezn:   Hace ruidos de succin o de chasquido mientras se alimenta.  Dolor en el pezn. Si cree que el beb no se prendi correctamente, deslice el dedo en la comisura de la boca y [employer] las encas del beb para interrumpir la succin. Intente comenzar a amamantar nuevamente. Signos de Fish farm manager Signos del beb:   Disminucin gradual en el nmero de succiones o cese completo de la succin.  Se duerme.  Relaja el cuerpo.  Retiene una pequea cantidad de [place] en su boca.  Se desprende solo del pecho. Signos que presenta usted:  Las mamas han aumentado la firmeza, el peso y el tamao 1 a 3 horas despus de Museum/gallery exhibitions officer.  Estn ms blandas inmediatamente despus de amamantar.  Un aumento  del volumen de [place], y tambin el cambio de su consistencia y color se producen hacia el quinto da de Tour manager.  Los pezones no duelen, ni estn agrietados ni sangran. Signos de que su beb recibe la cantidad de leche suficiente  Moja al menos 3 paales en 24 horas. La orina debe ser clara y de color amarillo plido a los [address] de [place].  Defeca al menos 3 veces en 24 horas a los 5 [address]  [address] de [address] La materia fecal debe ser blanda y [place].  Defeca al menos 3 veces en 24 horas a los [address] de [address] La materia fecal debe ser grumosa y [place].  No registra una prdida de peso mayor del 10% del peso al nacer durante los primeros 3 [address] de [place].  Aumenta de peso un promedio de 4 a 7onzas (120 a ) por semana despus de los 4 [address] de vida.  Aumenta de [place], [place], de [place] consistente a Glass blower/designer de los 5 [address] de vida, sin Passenger transport manager prdida de peso despus de las 2 semanas de vida. Despus de alimentarse, es posible que el beb regurgite una pequea cantidad. Esto es frecuente. FRECUENCIA Y DURACIN DE LA LACTANCIA MATERNA El amamantamiento frecuente la ayudar a producir ms [place] y a Education officer, community de Engineer, mining en los pezones e hinchazn en las [place]. Alimente al beb cuando muestre signos de hambre o si siente la necesidad de reducir la congestin de las [place]. Esto se denomina "lactancia a demanda". Evite el uso del chupete mientras trabaja para establecer la lactancia (las primeras 4 a 6 semanas despus del nacimiento del beb). Despus de este perodo, podr ofrecerle un chupete. Las investigaciones demostraron que el uso del chupete durante el primer ao de vida del beb disminuye el riesgo de desarrollar el sndrome de muerte sbita del lactante (SMSL). Permita que el nio se alimente en cada mama todo lo que desee. Contine amamantando al beb hasta que haya terminado de alimentarse. Cuando el beb se desprende o se queda dormido mientras se est alimentando de la  primera mama, ofrzcale la segunda. Debido  a que, con frecuencia, los recin [place] las primeras semanas de vida, es posible que deba despertar a su beb para alimentarlo. Los horarios de Acupuncturist de un beb a otro. Sin embargo, las siguientes reglas pueden servir como gua para ayudarle a Lawyer que el beb se alimenta adecuadamente:  Se puede amamantar a los recin nacidos (bebs de 4 semanas o menos de vida) cada 1 a 3 horas.  No deben transcurrir ms de 3 horas durante el da o 5 horas durante la noche sin que se amamante a los recin nacidos.  Debe amamantar al beb 8 veces como mnimo, en un perodo de 24 horas, hasta que comience a introducir slidos en su dieta, a los 6 meses de vida aproximadamente. EXTRACCIN DE [employer] MATERNA La extraccin y Contractor de la leche materna le permiten asegurarse de que el beb se alimente exclusivamente de [place], aun en momentos en los que no puede amamantar. Esto tiene especial importancia si debe regresar al Aleen Campi en el perodo en que an est amamantando o si no puede estar presente en los momentos en que el beb debe alimentarse. Su asesor en lactancia puede orientarla sobre cunto tiempo es seguro almacenar [place].  El sacaleche es un aparato que le permite extraer leche de la mama a un recipiente estril. Luego, la leche materna extrada puede almacenarse en un refrigerador o freezer. Algunos sacaleches son Birdie Riddle, Delaney Meigs otros son elctricos. Consulte a su asesor en lactancia qu tipo ser ms conveniente para usted. Los sacaleches se pueden comprar, sin embargo, algunos hospitales y grupos de apoyo a la lactancia materna alquilan Sports coach. Un asesor en lactancia puede ensearle cmo extraer [employer], en caso de que prefiera no usar un sacaleche.  CMO CUIDAR LAS MAMAS DURANTE LA LACTANCIA MATERNA Los pezones se secan, agrietan y duelen durante la  Tour manager. Las siguientes recomendaciones pueden ayudarle a Pharmacologist las [employer] y sanas:  Careers information officer usar jabn en los pezones.  Use un sostn de soporte. Aunque no son esenciales, las camisetas sin mangas o los sostenes especiales para Museum/gallery exhibitions officer estn diseados para acceder fcilmente a las mamas, para Museum/gallery exhibitions officer sin tener que quitarse todo el sostn o la camiseta. Evite usar sostenes con aro o sostenes muy ajustados.  Seque al aire sus pezones durante 3 a despus de amamantar al beb.  Utilice solo apsitos de Haematologist sostn para Environmental health practitioner las prdidas de [place]. La prdida de un poco de [employer] tomas es normal.  Utilice lanolina sobre los pezones luego de Museum/gallery exhibitions officer. La lanolina ayuda a mantener la humedad normal de la piel. Si [place] lanolina pura, no tiene que lavarse los pezones antes de Corporate treasurer al beb. La lanolina pura no es txica para el beb. Adems, puede extraer [employer] algunas gotas de [place] materna y Engineer, maintenance (IT) suavemente esa [employer], para que la [place] se seque al aire. Durante las primeras semanas despus de dar a luz, algunas mujeres pueden experimentar hinchazn en las mamas (congestin [place]). La congestin puede hacer que sienta las mamas pesadas, calientes y sensibles al tacto. El pico de la congestin ocurre dentro de los 3 a 5 das despus del [place]. Las siguientes recomendaciones pueden ayudarle a Paramedic la congestin:  Vace por completo las mamas al [employer] o Environmental health practitioner. Puede aplicar calor hmedo en las mamas (en la ducha o con toallas hmedas para manos) antes de Museum/gallery exhibitions officer o extraer [employer]. Esto aumenta la circulacin y  ayuda a que la [employer]. Si el beb no vaca por completo las [address] cuando lo [address], extraiga la [place] restante despus de que haya finalizado.  Use un sostn ajustado (para amamantar o comn) o camiseta sin mangas durante 1 o 2 das para indicar al cuerpo que disminuya ligeramente la  produccin de [place].  Aplique compresas de hielo [employer], a menos que le resulte demasiado incmodo.  Asegrese de que el beb se encuentre en la posicin correcta mientras lo alimenta. Si la congestin persiste luego de 48 horas o despus de seguir estas recomendaciones, comunquese con su mdico o un Holiday representative. RECOMENDACIONES GENERALES PARA EL CUIDADO DE LA SALUD DURANTE LA LACTANCIA MATERNA  Consuma alimentos saludables. Alterne comidas y colaciones, comiendo 3 de cada Agricultural engineer. Dado que lo que come [employer], es posible que algunas comidas hagan que su beb se vuelva ms irritable de lo habitual. Evite comer este tipo de alimentos, si percibe que afectan de manera negativa al beb.  Beba leche, jugos de fruta y agua para Patent examiner su sed (aproximadamente 10 vasos al Futures trader).  Descanse con frecuencia, reljese y tome sus vitaminas prenatales para evitar la fatiga, el estrs y la anemia.  Contine con los autocontroles de la mama.  Evite masticar y fumar tabaco.  Evite el consumo de alcohol y drogas. Algunos medicamentos, que pueden ser perjudiciales para el beb, pueden pasar a travs de la [employer]. Es importante que consulte a su mdico antes de Medical sales representative, incluidos todos los medicamentos recetados y de [place], as como los suplementos vitamnicos y herbales. Puede quedar embarazada durante la lactancia. Si desea controlar la natalidad, consulte a su mdico cules son las opciones ms seguras para el beb. SOLICITE ATENCIN MDICA SI:   Usted siente que quiere dejar de Museum/gallery exhibitions officer o se siente frustrada con la lactancia.  Siente dolor en las mamas o en los pezones.  Sus pezones estn agrietados o Water quality scientist.  Sus pechos estn irritados, sensibles o calientes.  Tiene un rea hinchada en cualquiera de las mamas.  Siente escalofros o fiebre.  Tiene nuseas o vmitos.  Presenta una secrecin de otro lquido distinto de la  leche materna de los pezones.  Sus mamas no se llenan antes de Museum/gallery exhibitions officer al beb para el 5. da despus del parto.  Se siente triste y deprimida.  El beb est demasiado somnoliento como para comer bien.  El beb tiene problemas para dormir.  Moja menos de 3 paales en 24 horas.  Defeca menos de 3 veces en 24 horas.  La piel del beb o la parte blanca de sus ojos est amarilla.  El beb no ha aumentado de [place] a los [address] de [place]. SOLICITE ATENCIN MDICA DE INMEDIATO SI:   El beb est muy cansado (aletargado) y no se despierta para comer.  Le sube la fiebre sin causa. Document Released: 08/24/2005 Document Revised: 12/19/2012 [facility] Patient Information 2014 [place], [place].

## 2014-01-18 NOTE — Progress Notes (Signed)
Pt [facility] for first OB visit[facility]  Obese pregravid with borderline high mat wt gain today.  C/o severe N/V and heartburn.  NKFA.  Reports eating 1x/day.  Encouraged to drink Gatorade by MD d/t dehydration.  Taking ranitidine and phenergan prn for reflux and nausea.  No PNV.  Discussed wt gain goal of 11-20# overall and incorporating physical activity.  Encouraged small meals/snacks q2-3 hrs to ease nausea.  [facility]  F/u if referred.  Melanee LeftErin Cashwell, MPH RD LDN

## 2014-01-18 NOTE — Progress Notes (Signed)
Reports lower back pain  

## 2014-01-18 NOTE — Progress Notes (Signed)
New OB visit--[facility] She desires 17P again which she was on with last pregnancy-ordered Desires no more children states she has failed other methods and desires permanent sterility.  She was counseled about LARC's--she is declining that at this time.

## 2014-01-18 NOTE — Progress Notes (Signed)
U/S scheduled in U/S department for 02/26/14 at 915 am.

## 2014-01-22 ENCOUNTER — Ambulatory Visit (INDEPENDENT_AMBULATORY_CARE_PROVIDER_SITE_OTHER): Payer: Medicaid Other | Admitting: *Deleted

## 2014-01-22 VITALS — BP 87/58 | HR 60 | Temp 98.2°F

## 2014-01-22 DIAGNOSIS — O09219 Supervision of pregnancy with history of pre-term labor, unspecified trimester: Secondary | ICD-10-CM

## 2014-01-22 MED ORDER — HYDROXYPROGESTERONE CAPROATE 250 MG/ML IM OIL
250.0000 mg | TOPICAL_OIL | INTRAMUSCULAR | Status: DC
Start: 2014-01-22 — End: 2014-02-08
  Administered 2014-01-22 – 2014-02-08 (×3): 250 mg via INTRAMUSCULAR

## 2014-01-22 MED ORDER — HYDROXYPROGESTERONE CAPROATE 250 MG/ML IM OIL: 250.0000 mg | TOPICAL_OIL | Freq: Once | INTRAMUSCULAR | Status: DC

## 2014-01-30 ENCOUNTER — Ambulatory Visit (INDEPENDENT_AMBULATORY_CARE_PROVIDER_SITE_OTHER): Payer: Medicaid Other | Admitting: *Deleted

## 2014-01-30 VITALS — BP 119/76 | HR 83 | Temp 98.7°F

## 2014-01-30 DIAGNOSIS — O09219 Supervision of pregnancy with history of pre-term labor, unspecified trimester: Secondary | ICD-10-CM

## 2014-01-30 DIAGNOSIS — O09213 Supervision of pregnancy with history of pre-term labor, third trimester: Secondary | ICD-10-CM

## 2014-02-02 ENCOUNTER — Encounter: Payer: Self-pay | Admitting: General Practice

## 2014-02-06 ENCOUNTER — Ambulatory Visit: Payer: Medicaid Other

## 2014-02-08 ENCOUNTER — Ambulatory Visit (INDEPENDENT_AMBULATORY_CARE_PROVIDER_SITE_OTHER): Payer: Medicaid Other | Admitting: Family Medicine

## 2014-02-08 VITALS — BP 94/56 | HR 74 | Temp 97.0°F | Wt 204.8 lb

## 2014-02-08 DIAGNOSIS — O09219 Supervision of pregnancy with history of pre-term labor, unspecified trimester: Secondary | ICD-10-CM

## 2014-02-08 DIAGNOSIS — O09899 Supervision of other high risk pregnancies, unspecified trimester: Secondary | ICD-10-CM

## 2014-02-08 DIAGNOSIS — O09299 Supervision of pregnancy with other poor reproductive or obstetric history, unspecified trimester: Secondary | ICD-10-CM

## 2014-02-08 LAB — POCT URINALYSIS DIP (DEVICE)
Bilirubin Urine: NEGATIVE
Glucose, UA: NEGATIVE mg/dL
Hgb urine dipstick: NEGATIVE
KETONES UR: 40 mg/dL — AB
Nitrite: NEGATIVE
PH: 6 (ref 5.0–8.0)
PROTEIN: NEGATIVE mg/dL
Specific Gravity, Urine: >=1.03 (ref 1.005–1.030)
UROBILINOGEN UA: 0.2 mg/dL (ref 0.0–1.0)

## 2014-02-08 NOTE — Progress Notes (Signed)
Edema-feet/hands  Pain/pressure-lower abd

## 2014-02-08 NOTE — Progress Notes (Signed)
Denies ctx's, LOF, vaginal bleeding.  Denies s/sx's PTL. On 17-P--will continue Reports vaginal swelling--no vaginal discharge.

## 2014-02-08 NOTE — Patient Instructions (Signed)
Segundo trimestre del embarazo  (Second Trimester of Pregnancy) El segundo trimestre del embarazo se extiende desde la semana 13 hasta la semana 28, del 4 al 6 mes. En general, es el momento del embarazo en el que se sentir mejor. Su organismo se ha adaptado a estar embarazada y comienza a sentirse fsicamente mejor. En general las nuseas matutinas han disminuido o han desaparecido completamente. El segundo trimestre es tambin la poca en la que el feto se desarrolla rpidamente. Hacia el final del sexto mes, el beb mide aproximadamente 9 pulgadas (23 cm) y pesa alrededor de 1  libras (700 g). Es probable que sienta mover al beb (dar pataditas) entre las 18 y 20 semanas del embarazo.  CAMBIOS CORPORALES  Su organismo atravesar numerosos cambios durante el embarazo. Los cambios varan de una mujer a otra.   Seguir aumentando de peso. Notar que la parte baja del abdomen se ensancha.  Podrn aparecer las primeras estras en las caderas, abdomen y mamas.  Es posible que sienta cefaleas, que se pueden aliviar con los medicamentos que su mdico le autorice a utilizar.  Tendr necesidad de orinar con ms frecuencia porque el feto est presionando en la vejiga.  Como consecuencia del embarazo, podr sentir acidez estomacal continuamente.  Podr estar constipada ya que ciertas hormonas hacen que los msculos que empujan los desechos a travs de los intestinos trabajen ms lentamente.  Pueden aparecer hemorroides o abultarse las venas (venas varicosas).  El dolor de espalda se debe al aumento de peso y a que las hormonas del embarazo relajan las articulaciones entre los huesos de la pelvis y a la modificacin del peso y a los msculos que soportan el equilibrio.  Sus mamas seguirn desarrollndose y estarn ms sensibles.  Las encas pueden sangrar y estar sensibles al cepillado y al hilo dental.  Pueden aparecer en el rostro zonas oscuras o manchas (cloasma, mscara del embarazo). Esto  desaparece despus del nacimiento del beb.  Es posible que se formeuna lnea oscura desde el ombligo a la zona del pubis (linea nigra). Esto desaparece despus del nacimiento del beb. QU DEBE ESPERAR EN LAS CONSULTAS PRENATALES  Durante una visita prenatal de rutina:   La pesarn para verificar que usted y el feto se encuentran dentro de los lmites normales.  Le tomarn la presin arterial.  Le medirn el abdomen para verificar el desarrollo del beb.  Escucharn los latidos fetales.  Se evaluarn los resultados de los estudios realizados en visitas anteriores. El mdico le preguntar:   Cmo se siente.  Si siente los movimientos del beb.  Si tiene sntomas anormales, como prdida de lquido, sangrado, dolores de cabeza intensos o clicos abdominales.  Si tiene alguna duda. Otros estudios que podrn realizarse durante el segundo trimestre son:   Anlisis de sangre para evaluar:  Niveles bajos de hierro (anemia).  Diabetes gestacional (entre las 24 y las 28 semanas).  Anticuerpos Rh.  Anlisis de orina para detectar infecciones, diabetes o protenas en la orina.  Una ecografa para confirmar si el crecimiento y el desarrollo del beb son los adecuados.  Una amniocentesis para diagnosticar posibles problemas genticos.  Estudios del feto para descartar espina bfida y sndrome de Down. INSTRUCCIONES PARA EL CUIDADO EN EL HOGAR   Evite fumar, consumir hierbas, beber alcohol y utilizar frmacos que no ne hayan recetado. Estas sustancias qumicas afectan la formacin y el desarrollo del beb.  Siga las indicaciones del profesional con respecto a como tomar los medicamentos. Durante el embarazo, hay   medicamentos que son seguros y otros no lo son.  Realice actividad fsica slo segn las indicaciones del mdico. Sentir clicos uterinos es el mejor signo para detener la actividad fsica.  Contine haciendo comidas regulares y sanas.  Use un sostn que le brinde buen  soporte si sus mamas estn sensibles.  No utilice la baera con agua caliente, baos turcos y saunas.  Colquese el cinturn de seguridad cuando conduzca.  Evite comer carne cruda y el contacto con los utensilios y desperdicios de los gatos. Estos elementos contienen grmenes que pueden causar defectos de nacimiento en el beb.  Tome las vitaminas indicadas para la etapa prenatal.  Pruebe un laxante (si el mdico la autoriza) si tiene constipacin. Consuma ms alimentos ricos en fibra, como vegetales y frutas frescos y cereales enteros. Beba gran cantidad de lquido para mantener la orina de tono claro o amarillo plido.  Tome baos de agua tibia para calmar el dolor o las molestias causadas por las hemorroides. Use una crema para las hemorroides si el mdico la autoriza.  Si tiene venas varicosas, use medias de soporte. Eleve los pies durante 15 minutos, 3 o 4 veces por da. Limite el consumo de sal en su dieta.  Evite levantar objetos pesados, use zapatos de tacones bajos y mantenga una buena postura.  Descanse con las piernas elevadas si tiene calambres o dolor de cintura.  Visite a su dentista si no lo ha hecho durante el embarazo. Use un cepillo de dientes blando para higienizarse los dientes y use suavemente el hilo dental.  Puede continuar su vida sexual siempre que el mdico la autorice.  Concurra a todas las visitas prenatales segn las indicaciones de su mdico. SOLICITE ATENCIN MDICA SI:   Tiene mareos.  Siente clicos leves, presin en la pelvis o dolor persistente en el abdomen.  Tiene nuseas o vmitos o diarrea persistentes.  Observa una secrecin vaginal con mal olor.  Siente dolor al orinar. SOLICITE ATENCIN MDICA DE INMEDIATO SI:   Tiene fiebre.  Pierde lquido por la vagina.  Tiene sangrando o pequeas prdidas vaginales.  Siente dolor intenso o clicos en el abdomen.  Sube o baja de peso rpidamente.  Tiene dificultad para respirar y le duele  pecho.  Sbitamente se le hincha el rostro, las manos, los tobillos, los pies o las piernas de manera extrema.  No ha sentido los movimientos del beb durante una hora.  Siente un dolor de cabeza intenso que no se alivia con medicamentos.  Su visin se modifica. Document Released: 06/03/2005 Document Revised: 04/26/2013 [employer] Patient Information 2014 [employer].  Lactancia materna (Breastfeeding) Decidir amamantar es una de las mejores elecciones que puede hacer por usted y su beb. El cambio hormonal durante el embarazo produce el desarrollo del tejido mamario y aumenta la cantidad y el tamao de los conductos galactforos. Estas hormonas tambin permiten que las protenas, los azcares y las grasas de la sangre produzcan la leche materna en las glndulas productoras de leche. Las hormonas impiden que la leche materna sea liberada antes del nacimiento del beb, adems de impulsar el flujo de leche luego del nacimiento. Una vez que ha comenzado a amamantar, pensar en el beb, as como la succin o el llanto, pueden estimular la liberacin de leche de las glndulas productoras de leche.  LOS BENEFICIOS DE AMAMANTAR Para el beb  La primera leche (calostro) ayuda al mejor funcionamiento del sistema digestivo del beb.  La leche tiene anticuerpos que ayudan a prevenir las infecciones en el   beb.  El beb tiene una menor incidencia de asma, alergias y del sndrome de muerte sbita del lactante.  Los nutrientes en la leche materna son mejores para el beb que la leche maternizada y estn preparados exclusivamente para cubrir las necesidades del beb.  La leche materna mejora el desarrollo cerebral del beb.  Es menos probable que el beb desarrolle otras enfermedades, como obesidad infantil, asma o diabetes mellitus de tipo 2. Para usted   La lactancia materna favorece el desarrollo de un vnculo muy especial entre la madre y el beb.  Es conveniente. Siempre est disponible a la  temperatura correcta y es econmica.  La lactancia materna ayuda a quemar caloras y a perder el peso ganado durante el embarazo.  Favorece la contraccin del tero al tamao que tena antes del embarazo de manera ms rpida y disminuye el sangrado (loquios) despus del parto.  La lactancia materna contribuye a reducir el riesgo de desarrollar diabetes mellitus de tipo 2, osteoporosis o cncer de mama o de ovario en el futuro. SIGNOS DE QUE EL BEB EST HAMBRIENTO Primeros signos de hambre  Aumenta su estado de alerta o actividad.  Se estira.  Mueve la cabeza de un lado a otro.  Mueve la cabeza y abre la boca cuando se le toca la mejilla o la comisura de la boca (reflejo de bsqueda).  Aumenta las vocalizaciones, tales como sonidos de succin, se relame los labios, emite arrullos, suspiros, o chirridos.  Mueve la mano hacia la boca.  Se chupa con ganas los dedos o las manos. Signos tardos de hambre  Est agitado.  Llora de manera intermitente. Signos de hambre extrema Los signos de hambre extrema requerirn que lo calme y lo consuele antes de que el beb pueda alimentarse adecuadamente. No espere a que se manifiesten los siguientes signos de hambre extrema para comenzar a amamantar:   Agitacin.  Llanto intenso y fuerte.   Gritos. INFORMACIN BSICA SOBRE LA LACTANCIA MATERNA Iniciacin de la lactancia materna  Encuentre un lugar cmodo para sentarse o acostarse, con un buen respaldo para el cuello y la espalda.  Coloque una almohada o una manta enrollada debajo del beb para acomodarlo a la altura de la mama (si est sentada). Las almohadas para amamantar se han diseado especialmente a fin de servir de apoyo para los brazos y el beb mientras amamanta.  Asegrese de que el abdomen del beb est frente al suyo.  Masajee suavemente la mama. Con las yemas de los dedos, masajee la pared del pecho hacia el pezn en un movimiento circular. Esto estimula el flujo de leche. Es  posible que deba continuar este movimiento mientras amamanta si la leche fluye lentamente.  Sostenga la mama con el pulgar por arriba del pezn y los otros 4 dedos por debajo de la mama. Asegrese de que los dedos se encuentren lejos del pezn y de la boca del beb.  Empuje suavemente los labios del beb con el pezn o con el dedo.  Cuando la boca del beb se abra lo suficiente, acrquelo rpidamente a la mama e introduzca todo el pezn y la zona oscura que lo rodea (areola), tanto como sea posible, dentro de la boca del beb.  Debe haber ms areola visible por arriba del labio superior del beb que por debajo del labio inferior.  La lengua del beb debe estar entre la enca inferior y la mama.  Asegrese de que la boca del beb est en la posicin correcta alrededor del pezn (prendida).   Los labios del beb deben crear un sello sobre la mama, doblndose hacia afuera (invertidos).  Es comn que el beb succione durante 2 a 3 minutos para que comience el flujo de leche materna. Cmo debe prenderse Es muy importante que le ensee al beb cmo prenderse adecuadamente a la mama. Si el beb no se prende adecuadamente, puede causarle dolor en el pezn y reducir la produccin de leche materna, y hacer que el beb tenga un escaso aumento de peso. Adems, si el beb no se prende adecuadamente al pezn, puede tragar aire durante la alimentacin. Esto puede causarle molestias al beb. Hacer eructar al beb al cambiar de mama puede ayudarlo a liberar el aire. Sin embargo, ensearle al beb cmo prenderse a la mama adecuadamente es la mejor manera de evitar que se sienta molesto por tragar aire mientras se alimenta. Signos de que el beb se ha prendido adecuadamente al pezn:   Tironea o succiona de modo silencioso, sin causarle dolor.  Se escucha que traga cada 3 o 4 succiones.   Hay movimientos musculares por arriba y por delante de sus odos al succionar. Signos de que el beb no se ha prendido  adecuadamente al pezn:   Hace ruidos de succin o de chasquido mientras se alimenta.  Dolor en el pezn. Si cree que el beb no se prendi correctamente, deslice el dedo en la comisura de la boca y colquelo entre las encas del beb para interrumpir la succin. Intente comenzar a amamantar nuevamente. Signos de lactancia materna exitosa Signos del beb:   Disminucin gradual en el nmero de succiones o cese completo de la succin.  Se duerme.  Relaja el cuerpo.  Retiene una pequea cantidad de leche en su boca.  Se desprende solo del pecho. Signos que presenta usted:  Las mamas han aumentado la firmeza, el peso y el tamao 1 a 3 horas despus de amamantar.  Estn ms blandas inmediatamente despus de amamantar.  Un aumento del volumen de leche, y tambin el cambio de su consistencia y color se producen hacia el quinto da de lactancia materna.  Los pezones no duelen, ni estn agrietados ni sangran. Signos de que su beb recibe la cantidad de leche suficiente  Moja al menos 3 paales en 24 horas. La orina debe ser clara y de color amarillo plido a los 5 das de vida.  Defeca al menos 3 veces en 24 horas a los 5 das de vida. La materia fecal debe ser blanda y amarillenta.  Defeca al menos 3 veces en 24 horas a los 7 das de vida. La materia fecal debe ser grumosa y amarillenta.  No registra una prdida de peso mayor del 10% del peso al nacer durante los primeros 3 das de vida.  Aumenta de peso un promedio de 4 a 7onzas (120 a 210ml) por semana despus de los 4 das de vida.  Aumenta de peso, diariamente, de manera consistente a partir de los 5 das de vida, sin registrar prdida de peso despus de las 2 semanas de vida. Despus de alimentarse, es posible que el beb regurgite una pequea cantidad. Esto es frecuente. FRECUENCIA Y DURACIN DE LA LACTANCIA MATERNA El amamantamiento frecuente la ayudar a producir ms leche y a prevenir problemas de dolor en los pezones e  hinchazn en las mamas. Alimente al beb cuando muestre signos de hambre o si siente la necesidad de reducir la congestin de las mamas. Esto se denomina "lactancia a demanda". Evite el uso del chupete mientras   trabaja para establecer la lactancia (las primeras 4 a 6 semanas despus del nacimiento del beb). Despus de este perodo, podr ofrecerle un chupete. Las investigaciones demostraron que el uso del chupete durante el primer ao de vida del beb disminuye el riesgo de desarrollar el sndrome de muerte sbita del lactante (SMSL). Permita que el nio se alimente en cada mama todo lo que desee. Contine amamantando al beb hasta que haya terminado de alimentarse. Cuando el beb se desprende o se queda dormido mientras se est alimentando de la primera mama, ofrzcale la segunda. Debido a que, con frecuencia, los recin nacidos permanecen somnolientos las primeras semanas de vida, es posible que deba despertar a su beb para alimentarlo. Los horarios de lactancia varan de un beb a otro. Sin embargo, las siguientes reglas pueden servir como gua para ayudarle a garantizar que el beb se alimenta adecuadamente:  Se puede amamantar a los recin nacidos (bebs de 4 semanas o menos de vida) cada 1 a 3 horas.  No deben transcurrir ms de 3 horas durante el da o 5 horas durante la noche sin que se amamante a los recin nacidos.  Debe amamantar al beb 8 veces como mnimo, en un perodo de 24 horas, hasta que comience a introducir slidos en su dieta, a los 6 meses de vida aproximadamente. EXTRACCIN DE LECHE MATERNA La extraccin y el almacenamiento de la leche materna le permiten asegurarse de que el beb se alimente exclusivamente de leche materna, aun en momentos en los que no puede amamantar. Esto tiene especial importancia si debe regresar al trabajo en el perodo en que an est amamantando o si no puede estar presente en los momentos en que el beb debe alimentarse. Su asesor en lactancia puede  orientarla sobre cunto tiempo es seguro almacenar leche materna.  El sacaleche es un aparato que le permite extraer leche de la mama a un recipiente estril. Luego, la leche materna extrada puede almacenarse en un refrigerador o freezer. Algunos sacaleches son manuales, mientras que otros son elctricos. Consulte a su asesor en lactancia qu tipo ser ms conveniente para usted. Los sacaleches se pueden comprar, sin embargo, algunos hospitales y grupos de apoyo a la lactancia materna alquilan sacaleches mensualmente. Un asesor en lactancia puede ensearle cmo extraer leche materna manualmente, en caso de que prefiera no usar un sacaleche.  CMO CUIDAR LAS MAMAS DURANTE LA LACTANCIA MATERNA Los pezones se secan, agrietan y duelen durante la lactancia materna. Las siguientes recomendaciones pueden ayudarle a mantener las mamas humectadas y sanas:  Evite usar jabn en los pezones.  Use un sostn de soporte. Aunque no son esenciales, las camisetas sin mangas o los sostenes especiales para amamantar estn diseados para acceder fcilmente a las mamas, para amamantar sin tener que quitarse todo el sostn o la camiseta. Evite usar sostenes con aro o sostenes muy ajustados.  Seque al aire sus pezones durante 3 a 4minutos despus de amamantar al beb.  Utilice solo apsitos de algodn en el sostn para absorber las prdidas de leche. La prdida de un poco de leche materna entre las tomas es normal.  Utilice lanolina sobre los pezones luego de amamantar. La lanolina ayuda a mantener la humedad normal de la piel. Si usa lanolina pura, no tiene que lavarse los pezones antes de alimentar al beb. La lanolina pura no es txica para el beb. Adems, puede extraer manualmente algunas gotas de leche materna y masajear suavemente esa leche sobre los pezones, para que la leche se seque al   aire. Durante las primeras semanas despus de dar a luz, algunas mujeres pueden experimentar hinchazn en las mamas (congestin  mamaria). La congestin puede hacer que sienta las mamas pesadas, calientes y sensibles al tacto. El pico de la congestin ocurre dentro de los 3 a 5 das despus del parto. Las siguientes recomendaciones pueden ayudarle a aliviar la congestin:  Vace por completo las mamas al amamantar o extraer leche. Puede aplicar calor hmedo en las mamas (en la ducha o con toallas hmedas para manos) antes de amamantar o extraer leche. Esto aumenta la circulacin y ayuda a que la leche fluya. Si el beb no vaca por completo las mamas cuando lo amamanta, extraiga la leche restante despus de que haya finalizado.  Use un sostn ajustado (para amamantar o comn) o camiseta sin mangas durante 1 o 2 das para indicar al cuerpo que disminuya ligeramente la produccin de leche.  Aplique compresas de hielo sobre las mamas, a menos que le resulte demasiado incmodo.  Asegrese de que el beb se encuentre en la posicin correcta mientras lo alimenta. Si la congestin persiste luego de 48 horas o despus de seguir estas recomendaciones, comunquese con su mdico o un asesor en lactancia. RECOMENDACIONES GENERALES PARA EL CUIDADO DE LA SALUD DURANTE LA LACTANCIA MATERNA  Consuma alimentos saludables. Alterne comidas y colaciones, comiendo 3 de cada una por da. Dado que lo que come afecta la leche materna, es posible que algunas comidas hagan que su beb se vuelva ms irritable de lo habitual. Evite comer este tipo de alimentos, si percibe que afectan de manera negativa al beb.  Beba leche, jugos de fruta y agua para satisfacer su sed (aproximadamente 10 vasos al da).  Descanse con frecuencia, reljese y tome sus vitaminas prenatales para evitar la fatiga, el estrs y la anemia.  Contine con los autocontroles de la mama.  Evite masticar y fumar tabaco.  Evite el consumo de alcohol y drogas. Algunos medicamentos, que pueden ser perjudiciales para el beb, pueden pasar a travs de la leche materna. Es importante  que consulte a su mdico antes de tomar cualquier medicamento, incluidos todos los medicamentos recetados y de venta libre, as como los suplementos vitamnicos y herbales. Puede quedar embarazada durante la lactancia. Si desea controlar la natalidad, consulte a su mdico cules son las opciones ms seguras para el beb. SOLICITE ATENCIN MDICA SI:   Usted siente que quiere dejar de amamantar o se siente frustrada con la lactancia.  Siente dolor en las mamas o en los pezones.  Sus pezones estn agrietados o sangran.  Sus pechos estn irritados, sensibles o calientes.  Tiene un rea hinchada en cualquiera de las mamas.  Siente escalofros o fiebre.  Tiene nuseas o vmitos.  Presenta una secrecin de otro lquido distinto de la leche materna de los pezones.  Sus mamas no se llenan antes de amamantar al beb para el 5. da despus del parto.  Se siente triste y deprimida.  El beb est demasiado somnoliento como para comer bien.  El beb tiene problemas para dormir.  Moja menos de 3 paales en 24 horas.  Defeca menos de 3 veces en 24 horas.  La piel del beb o la parte blanca de sus ojos est amarilla.  El beb no ha aumentado de peso a los 5 das de vida. SOLICITE ATENCIN MDICA DE INMEDIATO SI:   El beb est muy cansado (aletargado) y no se despierta para comer.  Le sube la fiebre sin causa. Document Released: 08/24/2005 Document   Revised: 12/19/2012 [employer] Patient Information 2014 [employer].  

## 2014-02-15 ENCOUNTER — Ambulatory Visit (INDEPENDENT_AMBULATORY_CARE_PROVIDER_SITE_OTHER): Payer: Self-pay | Admitting: *Deleted

## 2014-02-15 ENCOUNTER — Encounter: Payer: Self-pay | Admitting: *Deleted

## 2014-02-15 VITALS — BP 101/64 | HR 98

## 2014-02-15 DIAGNOSIS — O09299 Supervision of pregnancy with other poor reproductive or obstetric history, unspecified trimester: Secondary | ICD-10-CM

## 2014-02-15 DIAGNOSIS — O09219 Supervision of pregnancy with history of pre-term labor, unspecified trimester: Secondary | ICD-10-CM

## 2014-02-15 MED ORDER — HYDROXYPROGESTERONE CAPROATE 250 MG/ML IM OIL
250.0000 mg | TOPICAL_OIL | Freq: Once | INTRAMUSCULAR | Status: AC
Start: 2014-02-15 — End: 2014-02-15
  Administered 2014-02-15: 250 mg via INTRAMUSCULAR

## 2014-02-15 NOTE — Progress Notes (Signed)
Agree with nurses's documentation of this patient's clinic encounter for injection.  Tereso Newcomer, MD

## 2014-02-22 ENCOUNTER — Ambulatory Visit (INDEPENDENT_AMBULATORY_CARE_PROVIDER_SITE_OTHER): Payer: Medicaid Other | Admitting: *Deleted

## 2014-02-22 VITALS — BP 98/64 | HR 101 | Temp 97.8°F

## 2014-02-22 DIAGNOSIS — O09299 Supervision of pregnancy with other poor reproductive or obstetric history, unspecified trimester: Secondary | ICD-10-CM

## 2014-02-22 DIAGNOSIS — O09219 Supervision of pregnancy with history of pre-term labor, unspecified trimester: Secondary | ICD-10-CM

## 2014-02-22 MED ORDER — HYDROXYPROGESTERONE CAPROATE 250 MG/ML IM OIL
250.0000 mg | TOPICAL_OIL | INTRAMUSCULAR | Status: AC
  Administered 2014-02-22 – 2014-05-03 (×11): 250 mg via INTRAMUSCULAR

## 2014-02-22 NOTE — Progress Notes (Signed)
Patient ID: Janice Chase, female   DOB: 13-Apr-1981, 33 y.o.   MRN: [id number] Agree with nurses's documentation of this patient's clinic encounter.  Catalina AntiguaPeggy Constant, MD

## 2014-02-26 ENCOUNTER — Ambulatory Visit (HOSPITAL_COMMUNITY)
Admission: RE | Admit: 2014-02-26 | Discharge: 2014-02-26 | Disposition: A | Discharge status: Home / Self Care | Payer: Medicaid Other | Source: Ambulatory Visit | Attending: Family Medicine | Admitting: Family Medicine

## 2014-02-26 DIAGNOSIS — IMO0002 Reserved for concepts with insufficient information to code with codable children: Secondary | ICD-10-CM

## 2014-02-26 DIAGNOSIS — Z1389 Encounter for screening for other disorder: Secondary | ICD-10-CM | POA: Insufficient documentation

## 2014-02-26 DIAGNOSIS — Z363 Encounter for antenatal screening for malformations: Secondary | ICD-10-CM | POA: Insufficient documentation

## 2014-02-26 DIAGNOSIS — Z0489 Encounter for examination and observation for other specified reasons: Secondary | ICD-10-CM

## 2014-03-01 ENCOUNTER — Ambulatory Visit (INDEPENDENT_AMBULATORY_CARE_PROVIDER_SITE_OTHER): Payer: Medicaid Other | Admitting: Family

## 2014-03-01 VITALS — BP 93/64 | HR 85 | Wt 205.3 lb

## 2014-03-01 DIAGNOSIS — O09299 Supervision of pregnancy with other poor reproductive or obstetric history, unspecified trimester: Secondary | ICD-10-CM

## 2014-03-01 DIAGNOSIS — O3403 Maternal care for unspecified congenital malformation of uterus, third trimester: Secondary | ICD-10-CM

## 2014-03-01 DIAGNOSIS — O34219 Maternal care for unspecified type scar from previous cesarean delivery: Secondary | ICD-10-CM

## 2014-03-01 DIAGNOSIS — O09219 Supervision of pregnancy with history of pre-term labor, unspecified trimester: Secondary | ICD-10-CM

## 2014-03-01 DIAGNOSIS — Q512 Other doubling of uterus, unspecified: Principal | ICD-10-CM

## 2014-03-01 DIAGNOSIS — Z23 Encounter for immunization: Secondary | ICD-10-CM

## 2014-03-01 DIAGNOSIS — O09899 Supervision of other high risk pregnancies, unspecified trimester: Secondary | ICD-10-CM

## 2014-03-01 DIAGNOSIS — Q5128 Other doubling of uterus, other specified: Secondary | ICD-10-CM

## 2014-03-01 DIAGNOSIS — O3400 Maternal care for unspecified congenital malformation of uterus, unspecified trimester: Secondary | ICD-10-CM

## 2014-03-01 LAB — POCT URINALYSIS DIP (DEVICE)
Bilirubin Urine: NEGATIVE
Glucose, UA: NEGATIVE mg/dL
KETONES UR: NEGATIVE mg/dL
Nitrite: NEGATIVE
PROTEIN: NEGATIVE mg/dL
SPECIFIC GRAVITY, URINE: 1.025 (ref 1.005–1.030)
Urobilinogen, UA: 0.2 mg/dL (ref 0.0–1.0)
pH: 6 (ref 5.0–8.0)

## 2014-03-01 LAB — CBC
HEMATOCRIT: 36.7 % (ref 36.0–46.0)
Hemoglobin: 12.3 g/dL (ref 12.0–15.0)
MCH: 30.1 pg (ref 26.0–34.0)
MCHC: 33.5 g/dL (ref 30.0–36.0)
MCV: 89.7 fL (ref 78.0–100.0)
Platelets: 354 10*3/uL (ref 150–400)
RBC: 4.09 MIL/uL (ref 3.87–5.11)
RDW: 13.9 % (ref 11.5–15.5)
WBC: 9.1 10*3/uL (ref 4.0–10.5)

## 2014-03-01 MED ORDER — TETANUS-DIPHTH-ACELL PERTUSSIS 5-2.5-18.5 LF-MCG/0.5 IM SUSP: 0.5000 mL | Freq: Once | INTRAMUSCULAR | Status: DC

## 2014-03-01 MED ORDER — NITROFURANTOIN MONOHYD MACRO 100 MG PO CAPS: 100.0000 mg | ORAL_CAPSULE | Freq: Two times a day (BID) | ORAL | Status: DC

## 2014-03-01 NOTE — Progress Notes (Signed)
Patient reports occasional pelvic pain 

## 2014-03-01 NOTE — Progress Notes (Signed)
Dysuria, mod leuk > RX Macrobid, urine culture sent.  1 hr today with labs.  17p.  Reviewed ultrasound results (growth 50%ile).

## 2014-03-02 ENCOUNTER — Encounter: Payer: Self-pay | Admitting: Family

## 2014-03-02 LAB — RPR

## 2014-03-02 LAB — CULTURE, OB URINE
Colony Count: NO GROWTH
Organism ID, Bacteria: NO GROWTH

## 2014-03-02 LAB — GLUCOSE TOLERANCE, 1 HOUR (50G) W/O FASTING: Glucose, 1 Hour GTT: 97 mg/dL (ref 70–140)

## 2014-03-02 LAB — HIV ANTIBODY (ROUTINE TESTING W REFLEX): HIV 1&2 Ab, 4th Generation: NONREACTIVE

## 2014-03-08 ENCOUNTER — Ambulatory Visit (INDEPENDENT_AMBULATORY_CARE_PROVIDER_SITE_OTHER): Payer: Self-pay | Admitting: *Deleted

## 2014-03-08 VITALS — Wt 204.3 lb

## 2014-03-08 DIAGNOSIS — O09299 Supervision of pregnancy with other poor reproductive or obstetric history, unspecified trimester: Secondary | ICD-10-CM

## 2014-03-08 DIAGNOSIS — O09219 Supervision of pregnancy with history of pre-term labor, unspecified trimester: Secondary | ICD-10-CM

## 2014-03-12 ENCOUNTER — Telehealth: Payer: Self-pay | Admitting: *Deleted

## 2014-03-12 NOTE — Telephone Encounter (Signed)
[facility] to see if Vernona RiegerLaura needs a refill.  Verified per chart review Vernona RiegerLaura still on makena, we have about 2 doses in stock. Called pharmacy back and verified we do need next shipment sent.

## 2014-03-15 ENCOUNTER — Ambulatory Visit (INDEPENDENT_AMBULATORY_CARE_PROVIDER_SITE_OTHER): Payer: Self-pay | Admitting: Obstetrics & Gynecology

## 2014-03-15 VITALS — BP 99/62 | Wt 205.9 lb

## 2014-03-15 DIAGNOSIS — O09299 Supervision of pregnancy with other poor reproductive or obstetric history, unspecified trimester: Secondary | ICD-10-CM

## 2014-03-15 DIAGNOSIS — O09899 Supervision of other high risk pregnancies, unspecified trimester: Secondary | ICD-10-CM

## 2014-03-15 DIAGNOSIS — O34219 Maternal care for unspecified type scar from previous cesarean delivery: Secondary | ICD-10-CM

## 2014-03-15 DIAGNOSIS — Z8751 Personal history of pre-term labor: Secondary | ICD-10-CM

## 2014-03-15 LAB — POCT URINALYSIS DIP (DEVICE)
Bilirubin Urine: NEGATIVE
Glucose, UA: NEGATIVE mg/dL
Hgb urine dipstick: NEGATIVE
Ketones, ur: NEGATIVE mg/dL
NITRITE: NEGATIVE
PROTEIN: NEGATIVE mg/dL
Specific Gravity, Urine: 1.025 (ref 1.005–1.030)
UROBILINOGEN UA: 0.2 mg/dL (ref 0.0–1.0)
pH: 6.5 (ref 5.0–8.0)

## 2014-03-15 NOTE — Progress Notes (Signed)
Patient is Spanish-speaking only, Spanish interpreter present for this encounter. Still desires elective RCS for the sole reason of getting a BTS; had three successful VBACs.  Patient insists this is her choice, has thought about this and decided after talking to her family.   Discussed results of previous labs, all negative. Continue weekly 17P  No other complaints or concerns.  Fetal movement and labor precautions reviewed.

## 2014-03-15 NOTE — Patient Instructions (Signed)
Return to clinic for any obstetric concerns or go to [facility] for evaluation  

## 2014-03-22 ENCOUNTER — Ambulatory Visit (INDEPENDENT_AMBULATORY_CARE_PROVIDER_SITE_OTHER): Payer: Self-pay | Admitting: *Deleted

## 2014-03-22 DIAGNOSIS — Z7189 Other specified counseling: Secondary | ICD-10-CM

## 2014-03-29 ENCOUNTER — Ambulatory Visit (INDEPENDENT_AMBULATORY_CARE_PROVIDER_SITE_OTHER): Payer: Self-pay | Admitting: Obstetrics & Gynecology

## 2014-03-29 VITALS — BP 103/55 | HR 102 | Temp 98.1°F | Wt 207.3 lb

## 2014-03-29 DIAGNOSIS — O09899 Supervision of other high risk pregnancies, unspecified trimester: Secondary | ICD-10-CM

## 2014-03-29 DIAGNOSIS — O34219 Maternal care for unspecified type scar from previous cesarean delivery: Secondary | ICD-10-CM

## 2014-03-29 LAB — POCT URINALYSIS DIP (DEVICE)
GLUCOSE, UA: NEGATIVE mg/dL
Ketones, ur: NEGATIVE mg/dL
NITRITE: NEGATIVE
Protein, ur: 30 mg/dL — AB
Specific Gravity, Urine: 1.025 (ref 1.005–1.030)
UROBILINOGEN UA: 0.2 mg/dL (ref 0.0–1.0)
pH: 6 (ref 5.0–8.0)

## 2014-03-29 MED ORDER — BETAMETHASONE SOD PHOS & ACET 6 (3-3) MG/ML IJ SUSP
12.0000 mg | INTRAMUSCULAR | Status: AC
  Administered 2014-03-29 – 2014-03-30 (×2): 12 mg via INTRAMUSCULAR

## 2014-03-29 NOTE — Progress Notes (Signed)
Pressure and possible contractions, mucus discharge. Mild PCD, recommend betamethasone X2 and 17 P today

## 2014-03-29 NOTE — Progress Notes (Signed)
Vaginal discharge- pink-tinged fluid when wipe  Pain- stomach" and have lower back pain

## 2014-03-29 NOTE — Patient Instructions (Addendum)
Informacin sobre Government social research officerel parto prematuro  (Preterm Labor Information) El parto prematuro comienza antes de la semana 37 de [place]embarazo. La duracin de un embarazo normal es de 39 a 41 semanas.  CAUSAS  Generalmente no hay una causa que pueda identificarse del motivo por el que una mujer comienza un trabajo de parto prematuro. Sin embargo, una de las causas conocidas ms frecuentes son las infecciones. Las infecciones del tero, el cuello, la vagina, el lquido [place] Mead[place] Lakeamnitico, la vejiga, los riones y Teacher, adult educationhasta de los pulmones (neumona) pueden hacer que el trabajo de parto se inicie. Otras causas que pueden sospecharse son:   Infecciones urogenitales, como infecciones por hongos y vaginosis bacteriana.   Anormalidades uterinas (forma del tero, sptum uterino, fibromas, hemorragias en la placenta).   Un cuello que ha sido operado (puede ser que no permanezca cerrado).   Malformaciones del feto.   Gestaciones mltiples (mellizos, trillizos y ms).   Ruptura del saco amnitico.  FACTORES DE RIESGO   Historia previa de parto prematuro.   Tener ruptura prematura de las membranas (RPM).   La placenta cubre la abertura del cuello (placenta previa).   La placenta se separa del tero (abrupcin placentaria).   El cuello es demasiado dbil para contener al beb en el tero (cuello incompetente).   Hay mucho lquido en el saco amnitico (polihidramnios).   Consumo de drogas o hbito de fumar durante Firefighterel embarazo.   No aumentar de peso lo suficiente durante el [place]azo.   Mujeres menores de 18 aos o mayores de [address] [place] aos.   Nivel socioeconmico bajo.   Pertenecer a Engineer, productionla raza afroamericana. SNTOMAS  Los signos y sntomas del trabajo de parto prematuro son:   Public librarianClicos similares a los Designer, jewellerymenstruales, dolor abdominal o dolor de espalda.  Contracciones uterinas regulares, tan frecuentes como seis por hora, sin importar su intensidad (pueden ser suaves o dolorosas).  Contracciones que comienzan  en la parte superior del tero y se expanden hacia abajo, a la zona inferior del abdomen y la espalda.   Sensacin de aumento de presin en la pelvis.   Aparece una secrecin acuosa o sanguinolenta por la vagina.  TRATAMIENTO  Segn el tiempo del embarazo y otras [place] Lake Estatescircunstancias, el mdico puede indicar reposo en cama. Si es necesario, le indicarn medicamentos para TEFL teacherdetener las contracciones y para Customer service managermadurar los pulmones del feto. Si el trabajo de parto se inicia antes de las 34 semanas de [place]arazo, se recomienda la hospitalizacin. El tratamiento depende de las condiciones en que se encuentren usted y el feto.  QU DEBE HACER SI PIENSA QUE EST EN TRABAJO DE PARTO PREMATURO?  Comunquese con su mdico inmediatamente. Debe concurrir al hospital para ser controlada inmediatamente.  CMO PUEDE EVITAR EL TRABAJO DE PARTO PREMATURO EN FUTUROS EMBARAZOS?  Usted debe:   Si fuma, abandonar el hbito.  Mantener un peso saludable y evitar sustancias qumicas y drogas innecesarias.  Controlar todo tipo de infeccin.  Informe a su mdico si tiene una historia conocida de trabajo de parto prematuro. Document Released: 12/01/2007 Document Revised: 04/26/2013 [facility] Patient Information 2015 [place], [place]. This information is not intended to replace advice given to you by your health care provider. Make sure you discuss any questions you have with your health care provider. Sterilization Information, Female Female sterilization is a procedure to permanently prevent pregnancy. There are different ways to perform sterilization, but all either block or close the fallopian tubes so that your eggs cannot reach your uterus. If your egg cannot reach your uterus, sperm  cannot fertilize the egg, and you cannot get pregnant.  Sterilization is performed by a surgical procedure. Sometimes these procedures are performed in a hospital while a patient is asleep. Sometimes they can be done in a clinic setting with  the patient awake. The fallopian tubes can be surgically cut, tied, or sealed through a procedure called tubal ligation. The fallopian tubes can also be closed with clips or rings. Sterilization can also be done by placing a tiny coil into each fallopian tube, which causes scar tissue to grow inside the tube. The scar tissue then blocks the tubes.  Discuss sterilization with your caregiver to answer any concerns you or your partner may have. You may want to ask what type of sterilization your caregiver performs. Some caregivers may not perform all the various options. Sterilization is permanent and should only be done if you are sure you do not want children or do not want any more children. Having a sterilization reversed may not be successful.  STERILIZATION PROCEDURES  Laparoscopic sterilization. This is a surgical method performed at a time other than right after childbirth. Two incisions are made in the lower abdomen. A thin, lighted tube (laparoscope) is inserted into one of the incisions and is used to perform the procedure. The fallopian tubes are closed with a ring or a clip. An instrument that uses heat could be used to seal the tubes closed (electrocautery).   Mini-laparotomy. This is a surgical method done 1 or 2 days after giving birth. Typically, a small incision is made just below the belly button (umbilicus) and the fallopian tubes are exposed. The tubes can then be sealed, tied, or cut.   Hysteroscopic sterilization. This is performed at a time other than right after childbirth. A tiny, spring-like coil is inserted through the cervix and uterus and placed into the fallopian tubes. The coil causes scaring and blocks the tubes. Other forms of contraception should be used for 3 months after the procedure to allow the scar tissue to form completely. Additionally, it is required hysterosalpingography be done 3 months later to ensure that the procedure was successful. Hysterosalpingography is a  procedure that uses X-rays to look at your uterus and fallopian tubes after a material to make them show up better has been inserted. IS STERILIZATION SAFE? Sterilization is considered safe with very rare complications. Risks depend on the type of procedure you have. As with any surgical procedure, there are risks. Some risks of sterilization by any means include:   Bleeding.  Infection.  Reaction to anesthesia medicine.  Injury to surrounding organs. Risks specific to having hysteroscopic coils placed include:  The coils may not be placed correctly the first time.   The coils may move out of place.   The tubes may not get completely blocked after 3 months.   Injury to surrounding organs when placing the coil.  HOW EFFECTIVE IS FEMALE STERILIZATION? Sterilization is nearly 100% effective, but it can fail. Depending on the type of sterilization, the rate of failure can be as high as 3%. After hysteroscopic sterilization with placement of fallopian tube coils, you will need back-up birth control for 3 months after the procedure. Sterilization is effective for a lifetime.  BENEFITS OF STERILIZATION  It does not affect your hormones, and therefore will not affect your menstrual periods, sexual desire, or performance.   It is effective for a lifetime.   It is safe.   You do not need to worry about getting pregnant. Keep in mind  that if you had the hysteroscopic placement procedure, you must wait 3 months after the procedure (or until your caregiver confirms) before pregnancy is not considered possible.   There are no side effects unlike other types of birth control (contraception).  DRAWBACKS OF STERILIZATION  You must be sure you do not want children or any more children. The procedure is permanent.   It does not provide protection against sexually transmitted infections (STIs).   The tubes can grow back together. If this happens, there is a risk of pregnancy. There is  also an increased risk (50%) of pregnancy being an ectopic pregnancy. This is a pregnancy that happens outside of the uterus. Document Released: 02/10/2008 Document Revised: 08/29/2013 Document Reviewed: 12/10/2011 [facility] Patient Information 2015 [place], [place]. This information is not intended to replace advice given to you by your health care provider. Make sure you discuss any questions you have with your health care provider.

## 2014-03-30 ENCOUNTER — Ambulatory Visit (INDEPENDENT_AMBULATORY_CARE_PROVIDER_SITE_OTHER): Payer: Self-pay

## 2014-03-30 VITALS — BP 107/82 | HR 91 | Temp 98.2°F | Wt 206.9 lb

## 2014-03-30 DIAGNOSIS — O09899 Supervision of other high risk pregnancies, unspecified trimester: Secondary | ICD-10-CM

## 2014-03-30 NOTE — Progress Notes (Signed)
Patient here today for second dose of betamethasone. 12mg  betamethasone administered to RUO quadrant of buttocks. Patient tolerated well. Advised patient to return to MAU should she experience any leakage of fluid, heavy bleeding, abdominal cramping or contractions increasing in strength and getting closer together. Patient verbalized understanding. No questions or concerns.

## 2014-04-05 ENCOUNTER — Ambulatory Visit (INDEPENDENT_AMBULATORY_CARE_PROVIDER_SITE_OTHER): Payer: Self-pay

## 2014-04-05 VITALS — BP 104/59 | HR 98 | Wt 209.5 lb

## 2014-04-05 DIAGNOSIS — O09213 Supervision of pregnancy with history of pre-term labor, third trimester: Secondary | ICD-10-CM

## 2014-04-05 DIAGNOSIS — O09219 Supervision of pregnancy with history of pre-term labor, unspecified trimester: Secondary | ICD-10-CM

## 2014-04-09 ENCOUNTER — Telehealth: Payer: Self-pay | Admitting: *Deleted

## 2014-04-09 NOTE — Telephone Encounter (Signed)
Makena patient assistance program called , needing to know if we need her last vial of Makena.  We have 3- 4 doses on hand, called Makena and notifed we do not need yet- will call when needed.

## 2014-04-12 ENCOUNTER — Ambulatory Visit (INDEPENDENT_AMBULATORY_CARE_PROVIDER_SITE_OTHER): Payer: Self-pay | Admitting: Obstetrics & Gynecology

## 2014-04-12 VITALS — BP 111/59 | HR 101 | Temp 98.8°F | Wt 208.7 lb

## 2014-04-12 DIAGNOSIS — O09899 Supervision of other high risk pregnancies, unspecified trimester: Secondary | ICD-10-CM

## 2014-04-12 LAB — POCT URINALYSIS DIP (DEVICE)
Glucose, UA: NEGATIVE mg/dL
Hgb urine dipstick: NEGATIVE
KETONES UR: NEGATIVE mg/dL
NITRITE: NEGATIVE
PH: 6 (ref 5.0–8.0)
Protein, ur: NEGATIVE mg/dL
Specific Gravity, Urine: 1.025 (ref 1.005–1.030)
Urobilinogen, UA: 0.2 mg/dL (ref 0.0–1.0)

## 2014-04-12 NOTE — Progress Notes (Signed)
Pt has no complaints.  Continue 17 P  Will follow fundal heights and repeat US if necessary for septate uterus.  Scheduled c/s for 39 weeks b/c pt wants a BTL at time of delivery.

## 2014-04-12 NOTE — Progress Notes (Signed)
Reports occasional edema in hands and feet.

## 2014-04-19 ENCOUNTER — Ambulatory Visit (INDEPENDENT_AMBULATORY_CARE_PROVIDER_SITE_OTHER): Payer: Self-pay | Admitting: *Deleted

## 2014-04-19 VITALS — BP 100/58 | HR 93 | Wt 210.0 lb

## 2014-04-19 DIAGNOSIS — O09219 Supervision of pregnancy with history of pre-term labor, unspecified trimester: Secondary | ICD-10-CM

## 2014-04-19 DIAGNOSIS — Z8751 Personal history of pre-term labor: Secondary | ICD-10-CM

## 2014-04-19 NOTE — Progress Notes (Signed)
Pt here for 17P only. Feeling well, feels baby moving.

## 2014-04-26 ENCOUNTER — Encounter: Payer: Self-pay | Admitting: Obstetrics and Gynecology

## 2014-04-26 ENCOUNTER — Ambulatory Visit (INDEPENDENT_AMBULATORY_CARE_PROVIDER_SITE_OTHER): Payer: Self-pay | Admitting: Obstetrics and Gynecology

## 2014-04-26 VITALS — BP 99/61 | HR 93 | Temp 97.9°F | Wt 209.9 lb

## 2014-04-26 DIAGNOSIS — O09299 Supervision of pregnancy with other poor reproductive or obstetric history, unspecified trimester: Secondary | ICD-10-CM

## 2014-04-26 DIAGNOSIS — Z789 Other specified health status: Secondary | ICD-10-CM

## 2014-04-26 DIAGNOSIS — O3403 Maternal care for unspecified congenital malformation of uterus, third trimester: Secondary | ICD-10-CM

## 2014-04-26 DIAGNOSIS — O09899 Supervision of other high risk pregnancies, unspecified trimester: Secondary | ICD-10-CM

## 2014-04-26 DIAGNOSIS — Q512 Other doubling of uterus, unspecified: Secondary | ICD-10-CM

## 2014-04-26 DIAGNOSIS — E669 Obesity, unspecified: Secondary | ICD-10-CM

## 2014-04-26 DIAGNOSIS — O99213 Obesity complicating pregnancy, third trimester: Secondary | ICD-10-CM

## 2014-04-26 DIAGNOSIS — O3400 Maternal care for unspecified congenital malformation of uterus, unspecified trimester: Secondary | ICD-10-CM

## 2014-04-26 DIAGNOSIS — O99210 Obesity complicating pregnancy, unspecified trimester: Secondary | ICD-10-CM

## 2014-04-26 DIAGNOSIS — Q5128 Other doubling of uterus, other specified: Secondary | ICD-10-CM

## 2014-04-26 DIAGNOSIS — O34219 Maternal care for unspecified type scar from previous cesarean delivery: Secondary | ICD-10-CM

## 2014-04-26 LAB — POCT URINALYSIS DIP (DEVICE)
Bilirubin Urine: NEGATIVE
GLUCOSE, UA: NEGATIVE mg/dL
Hgb urine dipstick: NEGATIVE
Ketones, ur: NEGATIVE mg/dL
Nitrite: NEGATIVE
PROTEIN: NEGATIVE mg/dL
SPECIFIC GRAVITY, URINE: 1.02 (ref 1.005–1.030)
Urobilinogen, UA: 0.2 mg/dL (ref 0.0–1.0)
pH: 6.5 (ref 5.0–8.0)

## 2014-04-26 NOTE — Progress Notes (Signed)
Patient is doing well, reports pelvic pressure and lower back pain. FM/PTL precautions reviewed. 17-P today

## 2014-04-26 NOTE — Progress Notes (Signed)
Pt has been having contractions this week, desires cervical exam.  Pt continues to experience low back pain

## 2014-04-27 ENCOUNTER — Encounter: Payer: Self-pay | Admitting: General Practice

## 2014-05-03 ENCOUNTER — Encounter (HOSPITAL_COMMUNITY): Payer: Self-pay | Admitting: General Practice

## 2014-05-03 ENCOUNTER — Inpatient Hospital Stay (HOSPITAL_COMMUNITY)
Admission: AD | Admit: 2014-05-03 | Discharge: 2014-05-03 | Disposition: A | Discharge status: Home / Self Care | Payer: Medicaid Other | Source: Ambulatory Visit | Attending: Obstetrics & Gynecology | Admitting: Obstetrics & Gynecology

## 2014-05-03 ENCOUNTER — Ambulatory Visit (INDEPENDENT_AMBULATORY_CARE_PROVIDER_SITE_OTHER): Payer: Self-pay | Admitting: Obstetrics & Gynecology

## 2014-05-03 VITALS — BP 103/61 | HR 105 | Temp 97.8°F | Wt 214.6 lb

## 2014-05-03 DIAGNOSIS — O34219 Maternal care for unspecified type scar from previous cesarean delivery: Secondary | ICD-10-CM | POA: Insufficient documentation

## 2014-05-03 DIAGNOSIS — O09299 Supervision of pregnancy with other poor reproductive or obstetric history, unspecified trimester: Secondary | ICD-10-CM

## 2014-05-03 DIAGNOSIS — O09219 Supervision of pregnancy with history of pre-term labor, unspecified trimester: Secondary | ICD-10-CM

## 2014-05-03 DIAGNOSIS — O3400 Maternal care for unspecified congenital malformation of uterus, unspecified trimester: Secondary | ICD-10-CM

## 2014-05-03 DIAGNOSIS — E669 Obesity, unspecified: Secondary | ICD-10-CM

## 2014-05-03 DIAGNOSIS — Z87891 Personal history of nicotine dependence: Secondary | ICD-10-CM | POA: Insufficient documentation

## 2014-05-03 DIAGNOSIS — O99210 Obesity complicating pregnancy, unspecified trimester: Secondary | ICD-10-CM

## 2014-05-03 DIAGNOSIS — O3403 Maternal care for unspecified congenital malformation of uterus, third trimester: Secondary | ICD-10-CM

## 2014-05-03 DIAGNOSIS — Q512 Other doubling of uterus, unspecified: Secondary | ICD-10-CM

## 2014-05-03 DIAGNOSIS — O09899 Supervision of other high risk pregnancies, unspecified trimester: Secondary | ICD-10-CM

## 2014-05-03 DIAGNOSIS — O99213 Obesity complicating pregnancy, third trimester: Secondary | ICD-10-CM

## 2014-05-03 DIAGNOSIS — O479 False labor, unspecified: Secondary | ICD-10-CM | POA: Insufficient documentation

## 2014-05-03 DIAGNOSIS — Q5128 Other doubling of uterus, other specified: Secondary | ICD-10-CM

## 2014-05-03 LAB — URINALYSIS, ROUTINE W REFLEX MICROSCOPIC
Bilirubin Urine: NEGATIVE
Glucose, UA: NEGATIVE mg/dL
HGB URINE DIPSTICK: NEGATIVE
Ketones, ur: NEGATIVE mg/dL
Leukocytes, UA: NEGATIVE
Nitrite: NEGATIVE
PROTEIN: NEGATIVE mg/dL
SPECIFIC GRAVITY, URINE: 1.01 (ref 1.005–1.030)
Urobilinogen, UA: 0.2 mg/dL (ref 0.0–1.0)
pH: 6 (ref 5.0–8.0)

## 2014-05-03 LAB — OB RESULTS CONSOLE GBS: STREP GROUP B AG: NEGATIVE

## 2014-05-03 LAB — POCT URINALYSIS DIP (DEVICE)
GLUCOSE, UA: NEGATIVE mg/dL
Hgb urine dipstick: NEGATIVE
NITRITE: NEGATIVE
Protein, ur: NEGATIVE mg/dL
Specific Gravity, Urine: 1.025 (ref 1.005–1.030)
UROBILINOGEN UA: 1 mg/dL (ref 0.0–1.0)
pH: 6.5 (ref 5.0–8.0)

## 2014-05-03 LAB — OB RESULTS CONSOLE GC/CHLAMYDIA
CHLAMYDIA, DNA PROBE: NEGATIVE
GC PROBE AMP, GENITAL: NEGATIVE

## 2014-05-03 LAB — POCT PREGNANCY, URINE: PREG TEST UR: POSITIVE — AB

## 2014-05-03 MED ORDER — OXYCODONE-ACETAMINOPHEN 5-325 MG PO TABS: 1.0000 | ORAL_TABLET | ORAL | Status: DC | PRN

## 2014-05-03 MED ORDER — NIFEDIPINE 10 MG PO CAPS: 10.0000 mg | ORAL_CAPSULE | ORAL | Status: DC | PRN

## 2014-05-03 NOTE — Progress Notes (Signed)
C/o of irregular, painful contractions--- reports has difficulty walking-- when she lies down they go away.

## 2014-05-03 NOTE — Progress Notes (Signed)
Patient is Spanish-speaking only, Spanish interpreter present for this encounter. Cervix 4/70/-3. Continue 17P, last dose next week if still pregnant. Pelvic cultures today.  Labor and fetal movement precautions reviewed.

## 2014-05-03 NOTE — Discharge Instructions (Signed)
Contracciones de Braxton Hicks °(Braxton Hicks Contractions) °Durante el embarazo, pueden presentarse contracciones uterinas que no siempre indican que está en trabajo de parto.  °¿QUÉ SON LAS CONTRACCIONES DE BRAXTON HICKS?  °Las contracciones que se presentan antes del trabajo de parto se conocen como contracciones de Braxton Hicks o falso trabajo de parto. Hacia el final del embarazo (32 a 34 semanas), estas contracciones pueden aparecen con más frecuencia y volverse más intensas. No corresponden al trabajo de parto verdadero porque estas contracciones no producen el agrandamiento (la dilatación) y el afinamiento del cuello del útero. Algunas veces, es difícil distinguirlas del trabajo de parto verdadero porque en algunos casos pueden ser muy intensas, y las personas tienen diferentes niveles de tolerancia al dolor. No debe sentirse avergonzada si concurre al hospital con falso trabajo de parto. En ocasiones, la única forma de saber si el trabajo de parto es verdadero es que el médico determine si hay cambios en el cuello del útero. °Si no hay problemas prenatales u otras complicaciones de salud asociadas con el embarazo, no habrá inconvenientes si la envían a su casa con falso trabajo de parto y espera que comience el verdadero. °CÓMO DIFERENCIAR EL TRABAJO DE PARTO FALSO DEL VERDADERO °Falso trabajo de parto °· Las contracciones del falso trabajo de parto duran menos y no son tan intensas como las verdaderas. °· Generalmente son irregulares. °· A menudo, se sienten en la parte delantera de la parte baja del abdomen y en la ingle, °· y pueden desaparecer cuando camina o cambia de posición mientras está acostada. °· Las contracciones se vuelven más débiles y su duración es menor a medida que el tiempo transcurre. °· Por lo general, no se hacen progresivamente más intensas, regulares y cercanas entre sí como en el caso del trabajo de parto verdadero. °Verdadero trabajo de parto °· Las contracciones del verdadero  trabajo de parto duran de 30 a 70 segundos, son muy regulares y suelen volverse más intensas, y aumenta su frecuencia. °· No desaparecen cuando camina. °· La molestia generalmente se siente en la parte superior del útero y se extiende hacia la zona inferior del abdomen y hacia la cintura. °· El médico podrá examinarla para determinar si el trabajo de parto es verdadero. El examen mostrará si el cuello del útero se está dilatando y afinando. °LO QUE DEBE RECORDAR °· Continúe haciendo los ejercicios habituales y siga otras indicaciones que el médico le dé. °· Tome todos los medicamentos como le indicó el médico. °· Concurra a las visitas prenatales regulares. °· Coma y beba con moderación si cree que está en trabajo de parto. °· Si las contracciones de Braxton Hicks le provocan incomodidad: °¨ Cambie de posición: si está acostada o descansando, camine; si está caminando, descanse. °¨ Siéntese y descanse en una bañera con agua tibia. °¨ Beba 2 o 3 vasos de agua. La deshidratación puede provocar contracciones. °¨ Respire lenta y profundamente varias veces por hora. °¿CUÁNDO DEBO BUSCAR ASISTENCIA MÉDICA INMEDIATA? °Solicite atención médica de inmediato si: °· Las contracciones se intensifican, se hacen más regulares y cercanas entre sí. °· Tiene una pérdida de líquido por la vagina. °· Tiene fiebre. °· Elimina mucosidad manchada con sangre. °· Tiene una hemorragia vaginal abundante. °· Tiene dolor abdominal permanente. °· Tiene un dolor en la zona lumbar que nunca tuvo antes. °· Siente que la cabeza del bebé empuja hacia abajo y ejerce presión en la zona pélvica. °· El bebé no se mueve tanto como solía. °Document Released: 06/03/2005 Document Revised: 08/29/2013 °ExitCare® Patient   Information ©2015 ExitCare, LLC. This information is not intended to replace advice given to you by your health care provider. Make sure you discuss any questions you have with your health care provider. ° °

## 2014-05-03 NOTE — MAU Note (Signed)
Plans for a repeat cesarean on 9-18.

## 2014-05-03 NOTE — MAU Note (Signed)
Patient states she is having about 6 contractions in one hour. Denies bleeding or leaking and reports good fetal movement.

## 2014-05-03 NOTE — Patient Instructions (Signed)
Regrese a la clinica cuando tenga su cita. Si tiene problemas o preguntas, llama a la clinica o vaya a la sal[facility] al [facility].    

## 2014-05-03 NOTE — MAU Provider Note (Signed)
Chief Complaint:  Labor Eval   TILLA WILBORN is a 33 y.o.  [phone] with IUP at [redacted]w[redacted]d presenting for Labor Eval . Patient states she has been having  regular, every 5 minutes contractions, minimal vaginal bleeding, intact membranes, with active fetal movement.    Menstrual History: OB History   Grav Para Term Preterm Abortions TAB SAB Ect Mult Living   0 0 0 0 0 4     Patient's last menstrual period was 08/25/2013.      Past Medical History  Diagnosis Date  . No pertinent past medical history   . Abnormal Pap smear   . H/O cesarean section   . Hx successful VBAC (vaginal birth after cesarean), currently pregnant     Past Surgical History  Procedure Laterality Date  . Cesarean section      Family History  Problem Relation Age of Onset  . Other Neg Hx   . Diabetes Father     History  Substance Use Topics  . Smoking status: Former Games developer  . Smokeless tobacco: Former Neurosurgeon    Quit date: 04/28/2005  . Alcohol Use: No     Comment: Used cocaine 7 years ago     No Known Allergies  Facility-administered medications prior to admission  Medication Dose Route Frequency Provider Last Rate Last Dose  . hydroxyprogesterone caproate (DELALUTIN) 250 mg/mL injection 250 mg  250 mg Intramuscular Weekly Adam Phenix, MD   250 mg at 05/03/14 1029  . Tdap (BOOSTRIX) injection 0.5 mL  0.5 mL Intramuscular Once Walidah N Karim, CNM       Prescriptions prior to admission  Medication Sig Dispense Refill  . calcium carbonate (TUMS - DOSED IN MG ELEMENTAL CALCIUM) 500 MG chewable tablet Chew 1-2 tablets by mouth daily as needed for indigestion or heartburn.      . Prenatal Vit-Fe Fumarate-FA (PRENATAL MULTIVITAMIN) TABS tablet Take 1 tablet by mouth daily at 12 noon.        Review of Systems - Negative except for what is mentioned in HPI.  Physical Exam  Blood pressure 105/73, pulse 78, temperature 98.2 F (36.8 C), temperature source Oral, resp. rate 20, height 4' 9.25"  (1.454 m), weight 97.705 kg (215 lb 6.4 oz), last menstrual period 08/25/2013, SpO2 98.00%. GENERAL: Well-developed, well-nourished female in no acute distress.  LUNGS: Clear to auscultation bilaterally.  HEART: Regular rate and rhythm. ABDOMEN: Soft, nontender, nondistended, gravid.  EXTREMITIES: Nontender, no edema, 2+ distal pulses. Dilation: 4 Effacement (%): 80 Cervical Position: Middle Station: -3 Presentation: Vertex Exam by:: Dr. Gayla Doss, MD  Presentation: cephalic FHT:  Baseline rate 130 bpm   Variability moderate  Accelerations present   Decelerations none Contractions: Every 3-5 mins   Labs: Results for orders placed during the hospital encounter of 05/03/14 (from the past 24 hour(s))  POCT PREGNANCY, URINE   Collection Time    05/03/14  7:15 PM      Result Value Ref Range   Preg Test, Ur POSITIVE (*) NEGATIVE  POCT URINALYSIS DIP (DEVICE)   Collection Time    05/03/14 10:19 AM      Result Value Ref Range   Glucose, UA NEGATIVE  NEGATIVE mg/dL   Bilirubin Urine SMALL (*) NEGATIVE   Ketones, ur TRACE (*) NEGATIVE mg/dL   Specific Gravity, Urine 1.025  1.005 - 1.030   Hgb urine dipstick NEGATIVE  NEGATIVE   pH 6.5  5.0 - 8.0   Protein, ur NEGATIVE  NEGATIVE  mg/dL   Urobilinogen, UA 1.0  0.0 - 1.0 mg/dL   Nitrite NEGATIVE  NEGATIVE   Leukocytes, UA SMALL (*) NEGATIVE    Imaging Studies:  No results found.  Assessment: ESTELENE CARMACK is  33 y.o. [id number] at [redacted]w[redacted]d presents with Labor Eval Ctx q 3-5 mins w/o cervical change. Membranes intact, FHT is Cat 1.   Plan: Braxton-Hicks Ctx - Percocet 5/325 # 5 given - Procardia  # 5 given - Return to MAU precautions given  Wenda Low 8/27/20158:54 PM  CRESENZO-DISHMAN,Torey Regan

## 2014-05-04 LAB — GC/CHLAMYDIA PROBE AMP
CT PROBE, AMP APTIMA: NEGATIVE
GC PROBE AMP APTIMA: NEGATIVE

## 2014-05-06 LAB — CULTURE, BETA STREP (GROUP B ONLY)

## 2014-05-07 ENCOUNTER — Ambulatory Visit: Payer: Self-pay

## 2014-05-07 ENCOUNTER — Encounter (HOSPITAL_COMMUNITY): Payer: Self-pay | Admitting: *Deleted

## 2014-05-07 ENCOUNTER — Inpatient Hospital Stay (HOSPITAL_COMMUNITY)
Admission: AD | Admit: 2014-05-07 | Discharge: 2014-05-07 | Disposition: A | Discharge status: Home / Self Care | Payer: Self-pay | Source: Ambulatory Visit | Attending: Obstetrics and Gynecology | Admitting: Obstetrics and Gynecology

## 2014-05-07 ENCOUNTER — Encounter: Payer: Self-pay | Admitting: Obstetrics & Gynecology

## 2014-05-07 DIAGNOSIS — O34219 Maternal care for unspecified type scar from previous cesarean delivery: Secondary | ICD-10-CM | POA: Insufficient documentation

## 2014-05-07 DIAGNOSIS — Z87891 Personal history of nicotine dependence: Secondary | ICD-10-CM | POA: Insufficient documentation

## 2014-05-07 DIAGNOSIS — O479 False labor, unspecified: Secondary | ICD-10-CM

## 2014-05-07 DIAGNOSIS — O368190 Decreased fetal movements, unspecified trimester, not applicable or unspecified: Secondary | ICD-10-CM | POA: Insufficient documentation

## 2014-05-07 NOTE — MAU Provider Note (Signed)
Attestation of Attending Supervision of Advanced Practitioner (PA/CNM/NP): Evaluation and management procedures were performed by the Advanced Practitioner under my supervision and collaboration.  I have reviewed the Advanced Practitioner's note and chart, and I agree with the management and plan.  Sivan Cuello, MD, FACOG Attending Obstetrician & Gynecologist Faculty Practice, [facility]   

## 2014-05-07 NOTE — MAU Provider Note (Signed)
  History     CSN: [id number]  Arrival date and time: 05/07/14 1130   None     Chief Complaint  Patient presents with  . Decreased Fetal Movement  . Contractions   HPI  Decreased fetal movement since Saturday.  Still feeling baby move daily but concerned that it is less.  Also concerned about her cervical dilatation, when will c-section be scheduled.  rx'd procardia 05/03/14 for contractions but has not taken medication.  Past Medical History  Diagnosis Date  . No pertinent past medical history   . Abnormal Pap smear   . H/O cesarean section   . Hx successful VBAC (vaginal birth after cesarean), currently pregnant     Past Surgical History  Procedure Laterality Date  . Cesarean section      Family History  Problem Relation Age of Onset  . Other Neg Hx   . Diabetes Father     History  Substance Use Topics  . Smoking status: Former Games developer  . Smokeless tobacco: Former Neurosurgeon    Quit date: 04/28/2005  . Alcohol Use: No     Comment: Used cocaine 7 years ago    Allergies: No Known Allergies  Facility-administered medications prior to admission  Medication Dose Route Frequency Provider Last Rate Last Dose  . hydroxyprogesterone caproate (DELALUTIN) 250 mg/mL injection 250 mg  250 mg Intramuscular Weekly Adam Phenix, MD   250 mg at 05/03/14 1029  . Tdap (BOOSTRIX) injection 0.5 mL  0.5 mL Intramuscular Once Walidah N Karim, CNM       Prescriptions prior to admission  Medication Sig Dispense Refill  . oxyCODONE-acetaminophen (PERCOCET/ROXICET) 5-325 MG per tablet Take 1 tablet by mouth every 4 (four) hours as needed for moderate pain.  5 tablet  0  . Prenatal Vit-Fe Fumarate-FA (PRENATAL MULTIVITAMIN) TABS tablet Take 1 tablet by mouth daily at 12 noon.        Review of Systems  Constitutional: Negative for fever and chills.  Respiratory: Negative for cough, hemoptysis and shortness of breath.   Cardiovascular: Negative for leg swelling.  Gastrointestinal:  Negative for nausea, vomiting, abdominal pain, diarrhea and constipation.  Genitourinary: Positive for frequency. Negative for dysuria and urgency.   Physical Exam   Blood pressure 94/62, pulse 95, temperature 97.6 F (36.4 C), resp. rate 16, last menstrual period 08/25/2013.  Physical Exam  Constitutional: She is oriented to person, place, and time. She appears well-developed and well-nourished.  HENT:  Head: Normocephalic and atraumatic.  Eyes: Conjunctivae and EOM are normal.  Neck: Normal range of motion.  Cardiovascular: Normal rate.   Respiratory: Effort normal. No respiratory distress.  GI: Soft. She exhibits no distension. There is no tenderness.  Musculoskeletal: Normal range of motion. She exhibits no edema.  Neurological: She is alert and oriented to person, place, and time.  Skin: Skin is warm and dry. No erythema.    Dilation: 4 Effacement (%): 80 Station: -3 Presentation: Vertex Exam by:: Ginger Morris RN  MAU Course  Procedures  MDM NST reactive  Assessment and Plan  c-section: has declined VBAC 2/2 wanting BTL, previously counseled multiple times given successful VBAC x 3, advised that given that her cervical dilatation has not changed/she is not currently in labor no indication for earlier cesarean.  Has appt 05/09/14.  Decreased fetal movement: NST reactive, reviewed fetal kick counts.  Patient reassured no need for BPP at this time given reactive NST.   Fordyce Lepak ROCIO 05/07/2014, 12:27 PM

## 2014-05-07 NOTE — Progress Notes (Signed)
I assisted Ginger RN, with discharge instructions. Eda H Royal Interpreter.

## 2014-05-07 NOTE — Discharge Instructions (Signed)
Tercer trimestre del embarazo  (Third Trimester of Pregnancy)  El tercer trimestre del embarazo abarca desde la semana 29 hasta la semana 42, desde el 7 mes hasta el 9. En este trimestre el beb (feto) se desarrolla muy rpidamente. Hacia el final del noveno mes, el beb que an no ha nacido mide alrededor de 20 pulgadas (45 cm) de largo. Y pesa entre 6 y 10 libras (2,700 y 4,500 kg).  CUIDADOS EN EL HOGAR   Evite fumar, consumir hierbas y beber alcohol. Evite los frmacos que no apruebe el mdico.  Slo tome los medicamentos que le haya indicado su mdico. Algunos medicamentos son seguros para tomar durante el embarazo y otros no lo son.  Haga ejercicios slo como le indique el mdico. Deje de hacer ejercicios si comienza a tener clicos.  Haga comidas regulares y sanas.  Use un sostn que le brinde buen soporte si sus mamas estn sensibles.  No utilice la baera con agua caliente, baos turcos y saunas.  Colquese el cinturn de seguridad cuando conduzca.  Evite comer carne cruda y el contacto con los utensilios y desperdicios de los gatos.  Tome las vitaminas indicadas para la etapa prenatal.  Trate de tomar medicamentos para mover el intestino (laxantes) segn lo necesario y si su mdico la autoriza. Consuma ms fibra comiendo frutas y vegetales frescos y granos enteros. Beba gran cantidad de lquido para mantener el pis (orina) de tono claro o amarillo plido.  Tome baos de agua tibia (baos de asiento) para calmar el dolor o las molestias causadas por las hemorroides. Use una crema para las hemorroides si el mdico la autoriza.  Si tiene venas hinchadas y abultadas (venas varicosas), use medias de soporte. Eleve (levante) los pies durante 15 minutos, 3 o 4 veces por da. Limite el consumo de sal en su dieta.  Evite levantar objetos pesados, usar tacones altos y sintese derecha.  Descanse con las piernas elevadas si tiene calambres o dolor de cintura.  Visite a su dentista  si no lo ha hecho durante el embarazo. Use un cepillo de dientes blando para higienizarse los dientes. Use suavemente el hilo dental.  Puede tener sexo (relaciones sexuales) siempre que el mdico la autorice.  No viaje por largas distancias si puede evitarlo. Slo hgalo con la aprobacin de su mdico.  Haga el curso pre parto.  Practique conducir hasta el hospital.  Prepare el bolso que llevar.  Prepare la habitacin del beb.  Concurra a los controles mdicos. SOLICITE AYUDA SI:   No est segura si est en trabajo de parto o ha roto la bolsa de aguas.  Tiene mareos.  Siente clicos intensos o presin en la zona baja del vientre (abdomen).  Siente un dolor persistente en la zona del vientre.  Tiene malestar estomacal (nuseas), devuelve (vomita), o tiene deposiciones acuosas (diarrea).  Advierte un olor ftido que proviene de la vagina.  Siente dolor al hacer pis (orinar). SOLICITE AYUDA DE INMEDIATO SI:   Tiene fiebre.  Pierde lquido o sangre por la vagina.  Tiene sangrando o pequeas prdidas vaginales.  Siente dolor intenso o clicos en el abdomen.  Sube o baja de peso rpidamente.  Tiene dificultad para respirar o siente dolor en el pecho.  Sbitamente se le hinchan el rostro, las manos, los tobillos, los pies o las piernas.  No ha sentido los movimientos del beb durante una hora.  Siente un dolor de cabeza intenso que no se alivia con medicamentos.  Su visin se modifica. Document   Released: 04/26/2013 ExitCare Patient Information 2015 [employer]. This information is not intended to replace advice given to you by your health care provider. Make sure you discuss any questions you have with your health care provider.  

## 2014-05-07 NOTE — Progress Notes (Signed)
I assisted Ginger RN, with questions. Eda H Royal Interpreter.

## 2014-05-07 NOTE — MAU Note (Signed)
Pt presents to MAU with complaints of contractions that started 4 days ago she was evaluated in MAU on Thursday August the 27 and was 4cm and was given medications and sent home after no cervical change

## 2014-05-08 NOTE — MAU Provider Note (Signed)
Attestation of Attending Supervision of Advanced Practitioner (CNM/NP): Evaluation and management procedures were performed by the Advanced Practitioner under my supervision and collaboration.  I have reviewed the Advanced Practitioner's note and chart, and I agree with the management and plan.  Eleina Jergens 05/08/2014 7:21 AM

## 2014-05-10 ENCOUNTER — Ambulatory Visit (INDEPENDENT_AMBULATORY_CARE_PROVIDER_SITE_OTHER): Payer: Self-pay | Admitting: Family Medicine

## 2014-05-10 VITALS — BP 101/61 | HR 92 | Wt 215.7 lb

## 2014-05-10 DIAGNOSIS — Q512 Other doubling of uterus, unspecified: Secondary | ICD-10-CM

## 2014-05-10 DIAGNOSIS — O3400 Maternal care for unspecified congenital malformation of uterus, unspecified trimester: Secondary | ICD-10-CM

## 2014-05-10 DIAGNOSIS — Q5128 Other doubling of uterus, other specified: Secondary | ICD-10-CM

## 2014-05-10 DIAGNOSIS — O3403 Maternal care for unspecified congenital malformation of uterus, third trimester: Secondary | ICD-10-CM

## 2014-05-10 DIAGNOSIS — O09899 Supervision of other high risk pregnancies, unspecified trimester: Secondary | ICD-10-CM

## 2014-05-10 LAB — POCT URINALYSIS DIP (DEVICE)
Glucose, UA: NEGATIVE mg/dL
Hgb urine dipstick: NEGATIVE
Ketones, ur: NEGATIVE mg/dL
Nitrite: NEGATIVE
PH: 6 (ref 5.0–8.0)
PROTEIN: 30 mg/dL — AB
Specific Gravity, Urine: 1.025 (ref 1.005–1.030)
UROBILINOGEN UA: 1 mg/dL (ref 0.0–1.0)

## 2014-05-10 NOTE — Patient Instructions (Signed)
Tercer trimestre del embarazo  (Third Trimester of Pregnancy)  El tercer trimestre del embarazo abarca desde la semana 29 hasta la semana 42, desde el 7 mes hasta el 9. En este trimestre el beb (feto) se desarrolla muy rpidamente. Hacia el final del noveno mes, el beb que an no ha nacido mide alrededor de 20 pulgadas (45 cm) de largo. Y pesa entre 6 y 10 libras (2,700 y 4,500 kg).  CUIDADOS EN EL HOGAR   Evite fumar, consumir hierbas y beber alcohol. Evite los frmacos que no apruebe el mdico.  Slo tome los medicamentos que le haya indicado su mdico. Algunos medicamentos son seguros para tomar durante el embarazo y otros no lo son.  Haga ejercicios slo como le indique el mdico. Deje de hacer ejercicios si comienza a tener clicos.  Haga comidas regulares y sanas.  Use un sostn que le brinde buen soporte si sus mamas estn sensibles.  No utilice la baera con agua caliente, baos turcos y saunas.  Colquese el cinturn de seguridad cuando conduzca.  Evite comer carne cruda y el contacto con los utensilios y desperdicios de los gatos.  Tome las vitaminas indicadas para la etapa prenatal.  Trate de tomar medicamentos para mover el intestino (laxantes) segn lo necesario y si su mdico la autoriza. Consuma ms fibra comiendo frutas y vegetales frescos y granos enteros. Beba gran cantidad de lquido para mantener el pis (orina) de tono claro o amarillo plido.  Tome baos de agua tibia (baos de asiento) para calmar el dolor o las molestias causadas por las hemorroides. Use una crema para las hemorroides si el mdico la autoriza.  Si tiene venas hinchadas y abultadas (venas varicosas), use medias de soporte. Eleve (levante) los pies durante 15 minutos, 3 o 4 veces por da. Limite el consumo de sal en su dieta.  Evite levantar objetos pesados, usar tacones altos y sintese derecha.  Descanse con las piernas elevadas si tiene calambres o dolor de cintura.  Visite a su dentista  si no lo ha hecho durante el embarazo. Use un cepillo de dientes blando para higienizarse los dientes. Use suavemente el hilo dental.  Puede tener sexo (relaciones sexuales) siempre que el mdico la autorice.  No viaje por largas distancias si puede evitarlo. Slo hgalo con la aprobacin de su mdico.  Haga el curso pre parto.  Practique conducir hasta el hospital.  Prepare el bolso que llevar.  Prepare la habitacin del beb.  Concurra a los controles mdicos. SOLICITE AYUDA SI:   No est segura si est en trabajo de parto o ha roto la bolsa de aguas.  Tiene mareos.  Siente clicos intensos o presin en la zona baja del vientre (abdomen).  Siente un dolor persistente en la zona del vientre.  Tiene malestar estomacal (nuseas), devuelve (vomita), o tiene deposiciones acuosas (diarrea).  Advierte un olor ftido que proviene de la vagina.  Siente dolor al hacer pis (orinar). SOLICITE AYUDA DE INMEDIATO SI:   Tiene fiebre.  Pierde lquido o sangre por la vagina.  Tiene sangrando o pequeas prdidas vaginales.  Siente dolor intenso o clicos en el abdomen.  Sube o baja de peso rpidamente.  Tiene dificultad para respirar o siente dolor en el pecho.  Sbitamente se le hinchan el rostro, las manos, los tobillos, los pies o las piernas.  No ha sentido los movimientos del beb durante una hora.  Siente un dolor de cabeza intenso que no se alivia con medicamentos.  Su visin se modifica. Document   Released: 04/26/2013 ExitCare Patient Information 2015 [employer]. This information is not intended to replace advice given to you by your health care provider. Make sure you discuss any questions you have with your health care provider.  

## 2014-05-10 NOTE — Progress Notes (Signed)
No complaints other than intermittent contractions.  Was given percocet in hospital and occasionally gets takes it, which helps to decrease the pain.  Finished 17-OH-P.  No change to cervix.  Follow up in 1 week.  Labor precautions given.

## 2014-05-18 ENCOUNTER — Ambulatory Visit (INDEPENDENT_AMBULATORY_CARE_PROVIDER_SITE_OTHER): Payer: Self-pay | Admitting: Family Medicine

## 2014-05-18 VITALS — BP 93/55 | HR 102 | Temp 98.3°F | Wt 215.5 lb

## 2014-05-18 DIAGNOSIS — O09899 Supervision of other high risk pregnancies, unspecified trimester: Secondary | ICD-10-CM

## 2014-05-18 DIAGNOSIS — O09299 Supervision of pregnancy with other poor reproductive or obstetric history, unspecified trimester: Secondary | ICD-10-CM

## 2014-05-18 DIAGNOSIS — N898 Other specified noninflammatory disorders of vagina: Secondary | ICD-10-CM

## 2014-05-18 DIAGNOSIS — O9989 Other specified diseases and conditions complicating pregnancy, childbirth and the puerperium: Secondary | ICD-10-CM

## 2014-05-18 DIAGNOSIS — O26893 Other specified pregnancy related conditions, third trimester: Secondary | ICD-10-CM

## 2014-05-18 LAB — POCT URINALYSIS DIP (DEVICE)
GLUCOSE, UA: NEGATIVE mg/dL
Hgb urine dipstick: NEGATIVE
Ketones, ur: NEGATIVE mg/dL
NITRITE: NEGATIVE
PROTEIN: NEGATIVE mg/dL
Specific Gravity, Urine: 1.02 (ref 1.005–1.030)
UROBILINOGEN UA: 1 mg/dL (ref 0.0–1.0)
pH: 6 (ref 5.0–8.0)

## 2014-05-18 NOTE — Patient Instructions (Signed)
Parto por cesrea  (Cesarean Delivery ) El parto por cesrea es el nacimiento de un beb a travs de un corte (incisin) en el abdomen y la matriz (tero).  INFORME A SU MDICO:  Todos los medicamentos que utiliza, incluyendo vitaminas, hierbas, gotas oftlmicas, cremas y medicamentos de venta libre.  Problemas previos que usted o los miembros de su familia hayan tenido con el uso de anestsicos.  Enfermedades de la sangre.  Cirugas previas.  Padecimientos mdicos.  Cualquier alergia que tenga.  Complicaciones del embarazo. RIESGOS Y COMPLICACIONES  Generalmente es un procedimiento seguro. Sin embargo, como en cualquier procedimiento, pueden surgir complicaciones. Las complicaciones posibles son:  Hemorragias.  Infeccin.  Cogulos sanguneos.  Lesin en los rganos circundantes.  Problemas con la anestesia.  Lesin al beb. ANTES DEL PROCEDIMIENTO   Le administrarn un medicamento anticido. Esto impedir que los contenidos cidos del estmago ingresen a los pulmones si vomita durante la ciruga.  Le podrn dar antibiticos para prevenir infecciones. PROCEDIMIENTO   Rasurarn la zona del pubis y la parte inferior del abdomen. Esto se realiza para evitar una infeccin en el sitio de la incisin.  Le colocarn un tubo (catter Foley) en la vejiga para drenar la orina desde la vejiga a una bolsa. Esto mantendr la vejiga vaca durante la ciruga.  Se le colocar una sonda intravenosa en una de las venas.  Le administrarn un medicamento para adormecer a zona inferior del cuerpo anestesia regional). Si estuviera en trabajo de parto, podrn aplicarle una anestesia epidural, que se utiliza tanto en el trabajo de parto como en la cesrea. Puede ser que le administren un medicamento que la har dormir (anestesia general), aunque esto no es tan frecuente.  Le harn una incisin en el abdomen que se extiende hacia el tero. Hay dos tipos bsicos de incisin:  La incisin  horizontal (transversa) Las incisiones horizontales se realizan en la mayor parte de las cesreas de rutina.  La incisin vertical. Se realiza desde la parte de arriba del abdomen hasta la parte de abajo y se usa con menos frecuencia. Se reserva para aquellas mujeres que tienen una complicacin grave (parto prematuro extremo) o bajo situaciones de emergencia.  Las incisiones horizontales y verticales pueden utilizarse ambas al mismo tiempo. Sin embargo, no es muy frecuente.  Luego se realiza una incisin en el tero para que nazca el beb.  El beb nacer.  Luego ambas incisiones se cierran con puntos absorbibles. DESPUS DEL PROCEDIMIENTO   Si estuvo despierta durante la ciruga, podr ver al beb enseguida. Si la duermen, ver al beb tan pronto como despierte.  Podr amamantar a su beb despus del procedimiento.  Podr levantarse y caminar el mismo da de la ciruga. Si debe permanecer en cama durante cierto tiempo, recibir ayuda para darse vuelta, toser y respirar profundamente despus de la ciruga. Esto ayuda a evitar complicaciones en los pulmones, como la neumona.  No se levante de la cama sola la primera vez luego de la ciruga. Necesitar ayuda para levantarse de la cama hasta que pueda hacerlo sola.  Podr darse una ducha el da siguiente a la ciruga. Despus que le quiten el apsito (vendaje) del sitio de la incisin, un enfermero la ayudar a ducharse, si necesita ayuda.  Tendr unas medias compresivas neumticas en la zona inferior de las piernas. Esto se realiza para prevenir la formacin de cogulos sanguneos. Cuando se levante y camine regularmente, ya no sern necesarias.  Nocruce las piernas al sentarse.  Si elimina cogulos   de sangre, gurdelos. Si elimina un cogulo cuando va al bao, por favor no tire la cadena. Llame al enfermero. Comunquele al enfermero si piensa que tiene demasiada hemorragia o que elimina muchos cogulos.  Le darn medicamentos si los  necesita. Dgale a los profesionales si siente dolor. Tambin le indicarn antibiticos para prevenir una infeccin.  Le quitarn la va intravenosa cuando beba una cantidad razonable de lquido. El catter Foley se retirar cuando se levante y camine.  Si su tipo sanguneo es Rh negativo y el beb es Rh positivo, le darn una inyeccin de inmunoglobulina anti D. Esta inyeccin evita que tenga problemas con el Rh en embarazos futuros. Deber colocarse la inyeccin an si se ha hecho atar las trompas (ligadura de trompas).  Si le permiten llevar al beb a dar un paseo, colquelo en la cunita y empjela. No lleve al beb en sus brazos. Document Released: 08/24/2005 Document Revised: 06/14/2013 ExitCare Patient Information 2015 [employer]. This information is not intended to replace advice given to you by your health care provider. Make sure you discuss any questions you have with your health care provider.  

## 2014-05-18 NOTE — Progress Notes (Signed)
Patient without complaints.  Creamy vaginal discharge.  Wet prep obtained.  Denies vaginal bleeding, contractions, loss of fluid.  Reports good fetal activity.  C-section next week.

## 2014-05-18 NOTE — Addendum Note (Signed)
Addended by: Levie Heritage on: 05/18/2014 10:57 AM   Modules accepted: Orders

## 2014-05-18 NOTE — Progress Notes (Signed)
Interpreter Marlynn Perking used during this encounter.  Reports pelvic pressure and lower back pain. Intermittent contractions.

## 2014-05-19 ENCOUNTER — Other Ambulatory Visit: Payer: Self-pay | Admitting: Family Medicine

## 2014-05-19 LAB — WET PREP, GENITAL
Clue Cells Wet Prep HPF POC: NONE SEEN
TRICH WET PREP: NONE SEEN
WBC, Wet Prep HPF POC: NONE SEEN

## 2014-05-21 ENCOUNTER — Other Ambulatory Visit: Payer: Self-pay | Admitting: Family Medicine

## 2014-05-21 ENCOUNTER — Telehealth: Payer: Self-pay

## 2014-05-21 MED ORDER — FLUCONAZOLE 150 MG PO TABS: 150.0000 mg | ORAL_TABLET | Freq: Once | ORAL | Status: DC

## 2014-05-21 NOTE — Telephone Encounter (Signed)
Called pt with Spanish interpreter, Alex, and informed pt that she has a yeast infection and that diflucan has been called into her pharmacy to [facility].  Pt stated understanding.

## 2014-05-21 NOTE — Telephone Encounter (Signed)
Message copied by Faythe Casa on Mon May 21, 2014 10:21 AM ------      Message from: Janice Chase      Created: Mon May 21, 2014  9:39 AM       Pt has yeast infection.  Diflucan called in.  Please notify patient. ------

## 2014-05-24 ENCOUNTER — Encounter (HOSPITAL_COMMUNITY): Payer: Self-pay | Admitting: *Deleted

## 2014-05-24 ENCOUNTER — Other Ambulatory Visit (HOSPITAL_COMMUNITY): Payer: Self-pay

## 2014-05-24 ENCOUNTER — Inpatient Hospital Stay (HOSPITAL_COMMUNITY)
Admission: AD | Admit: 2014-05-24 | Discharge: 2014-05-25 | DRG: 775 | Disposition: A | Discharge status: Home / Self Care | Payer: Medicaid Other | Source: Ambulatory Visit | Attending: Obstetrics & Gynecology | Admitting: Obstetrics & Gynecology

## 2014-05-24 DIAGNOSIS — O479 False labor, unspecified: Secondary | ICD-10-CM | POA: Diagnosis present

## 2014-05-24 DIAGNOSIS — Q512 Other doubling of uterus, unspecified: Secondary | ICD-10-CM

## 2014-05-24 DIAGNOSIS — O34219 Maternal care for unspecified type scar from previous cesarean delivery: Principal | ICD-10-CM | POA: Diagnosis present

## 2014-05-24 DIAGNOSIS — Z833 Family history of diabetes mellitus: Secondary | ICD-10-CM

## 2014-05-24 DIAGNOSIS — E669 Obesity, unspecified: Secondary | ICD-10-CM

## 2014-05-24 DIAGNOSIS — O99214 Obesity complicating childbirth: Secondary | ICD-10-CM

## 2014-05-24 DIAGNOSIS — Z87891 Personal history of nicotine dependence: Secondary | ICD-10-CM

## 2014-05-24 DIAGNOSIS — O3403 Maternal care for unspecified congenital malformation of uterus, third trimester: Secondary | ICD-10-CM

## 2014-05-24 DIAGNOSIS — O09299 Supervision of pregnancy with other poor reproductive or obstetric history, unspecified trimester: Secondary | ICD-10-CM

## 2014-05-24 DIAGNOSIS — O99213 Obesity complicating pregnancy, third trimester: Secondary | ICD-10-CM

## 2014-05-24 DIAGNOSIS — O09899 Supervision of other high risk pregnancies, unspecified trimester: Secondary | ICD-10-CM

## 2014-05-24 DIAGNOSIS — IMO0001 Reserved for inherently not codable concepts without codable children: Secondary | ICD-10-CM

## 2014-05-24 LAB — CBC
HEMATOCRIT: 37.9 % (ref 36.0–46.0)
Hemoglobin: 12.6 g/dL (ref 12.0–15.0)
MCH: 29.2 pg (ref 26.0–34.0)
MCHC: 33.2 g/dL (ref 30.0–36.0)
MCV: 87.9 fL (ref 78.0–100.0)
PLATELETS: 334 10*3/uL (ref 150–400)
RBC: 4.31 MIL/uL (ref 3.87–5.11)
RDW: 14.6 % (ref 11.5–15.5)
WBC: 9.7 10*3/uL (ref 4.0–10.5)

## 2014-05-24 LAB — HIV ANTIBODY (ROUTINE TESTING W REFLEX): HIV: NONREACTIVE

## 2014-05-24 LAB — RPR

## 2014-05-24 LAB — TYPE AND SCREEN
ABO/RH(D): O POS
ANTIBODY SCREEN: NEGATIVE

## 2014-05-24 MED ORDER — METHYLERGONOVINE MALEATE 0.2 MG PO TABS: 0.2000 mg | ORAL_TABLET | ORAL | Status: DC | PRN

## 2014-05-24 MED ORDER — LANOLIN HYDROUS EX OINT: 1.0000 "application " | TOPICAL_OINTMENT | CUTANEOUS | Status: DC | PRN

## 2014-05-24 MED ORDER — TUBERCULIN PPD 5 UNIT/0.1ML ID SOLN: 5.0000 [IU] | Freq: Once | INTRADERMAL | Status: DC

## 2014-05-24 MED ORDER — METHYLERGONOVINE MALEATE 0.2 MG/ML IJ SOLN: 0.2000 mg | INTRAMUSCULAR | Status: DC | PRN

## 2014-05-24 MED ORDER — LACTATED RINGERS IV SOLN: INTRAVENOUS | Status: DC

## 2014-05-24 MED ORDER — LIDOCAINE HCL (PF) 1 % IJ SOLN
30.0000 mL | INTRAMUSCULAR | Status: DC | PRN
  Filled 2014-05-24: qty 30

## 2014-05-24 MED ORDER — OXYTOCIN 40 UNITS IN LACTATED RINGERS INFUSION - SIMPLE MED
62.5000 mL/h | INTRAVENOUS | Status: DC
  Filled 2014-05-24: qty 1000

## 2014-05-24 MED ORDER — IBUPROFEN 600 MG PO TABS
600.0000 mg | ORAL_TABLET | Freq: Four times a day (QID) | ORAL | Status: DC
  Administered 2014-05-24 – 2014-05-25 (×4): 600 mg via ORAL
  Filled 2014-05-24 (×4): qty 1

## 2014-05-24 MED ORDER — ONDANSETRON HCL 4 MG/2ML IJ SOLN: 4.0000 mg | Freq: Four times a day (QID) | INTRAMUSCULAR | Status: DC | PRN

## 2014-05-24 MED ORDER — SENNOSIDES-DOCUSATE SODIUM 8.6-50 MG PO TABS
2.0000 | ORAL_TABLET | ORAL | Status: DC
  Filled 2014-05-24: qty 2

## 2014-05-24 MED ORDER — ONDANSETRON HCL 4 MG/2ML IJ SOLN: 4.0000 mg | INTRAMUSCULAR | Status: DC | PRN

## 2014-05-24 MED ORDER — DIPHENHYDRAMINE HCL 25 MG PO CAPS: 25.0000 mg | ORAL_CAPSULE | Freq: Four times a day (QID) | ORAL | Status: DC | PRN

## 2014-05-24 MED ORDER — OXYCODONE-ACETAMINOPHEN 5-325 MG PO TABS
1.0000 | ORAL_TABLET | ORAL | Status: DC | PRN
  Administered 2014-05-24: 1 via ORAL
  Filled 2014-05-24: qty 1

## 2014-05-24 MED ORDER — MEASLES, MUMPS & RUBELLA VAC ~~LOC~~ INJ
0.5000 mL | INJECTION | Freq: Once | SUBCUTANEOUS | Status: DC
  Filled 2014-05-24: qty 0.5

## 2014-05-24 MED ORDER — ACETAMINOPHEN 325 MG PO TABS: 650.0000 mg | ORAL_TABLET | ORAL | Status: DC | PRN

## 2014-05-24 MED ORDER — CITRIC ACID-SODIUM CITRATE 334-500 MG/5ML PO SOLN: 30.0000 mL | ORAL | Status: DC | PRN

## 2014-05-24 MED ORDER — OXYTOCIN 40 UNITS IN LACTATED RINGERS INFUSION - SIMPLE MED
1.0000 m[IU]/min | INTRAVENOUS | Status: DC
  Administered 2014-05-24: 2 m[IU]/min via INTRAVENOUS

## 2014-05-24 MED ORDER — OXYCODONE-ACETAMINOPHEN 5-325 MG PO TABS: 2.0000 | ORAL_TABLET | ORAL | Status: DC | PRN

## 2014-05-24 MED ORDER — ZOLPIDEM TARTRATE 5 MG PO TABS: 5.0000 mg | ORAL_TABLET | Freq: Every evening | ORAL | Status: DC | PRN

## 2014-05-24 MED ORDER — LACTATED RINGERS IV SOLN: 500.0000 mL | INTRAVENOUS | Status: DC | PRN

## 2014-05-24 MED ORDER — FERROUS SULFATE 325 (65 FE) MG PO TABS
325.0000 mg | ORAL_TABLET | Freq: Two times a day (BID) | ORAL | Status: DC
  Administered 2014-05-24 – 2014-05-25 (×2): 325 mg via ORAL
  Filled 2014-05-24 (×2): qty 1

## 2014-05-24 MED ORDER — PRENATAL MULTIVITAMIN CH
1.0000 | ORAL_TABLET | Freq: Every day | ORAL | Status: DC
  Administered 2014-05-25: 1 via ORAL
  Filled 2014-05-24: qty 1

## 2014-05-24 MED ORDER — LACTATED RINGERS IV BOLUS (SEPSIS)
1000.0000 mL | Freq: Once | INTRAVENOUS | Status: DC
Start: 2014-05-24 — End: 2014-05-24

## 2014-05-24 MED ORDER — OXYTOCIN BOLUS FROM INFUSION: 500.0000 mL | INTRAVENOUS | Status: DC

## 2014-05-24 MED ORDER — ONDANSETRON HCL 4 MG PO TABS: 4.0000 mg | ORAL_TABLET | ORAL | Status: DC | PRN

## 2014-05-24 MED ORDER — IBUPROFEN 600 MG PO TABS
600.0000 mg | ORAL_TABLET | Freq: Four times a day (QID) | ORAL | Status: DC
  Administered 2014-05-24: 600 mg via ORAL
  Filled 2014-05-24: qty 1

## 2014-05-24 MED ORDER — DIBUCAINE 1 % RE OINT: 1.0000 "application " | TOPICAL_OINTMENT | RECTAL | Status: DC | PRN

## 2014-05-24 MED ORDER — TERBUTALINE SULFATE 1 MG/ML IJ SOLN: 0.2500 mg | Freq: Once | INTRAMUSCULAR | Status: DC | PRN

## 2014-05-24 MED ORDER — SIMETHICONE 80 MG PO CHEW: 80.0000 mg | CHEWABLE_TABLET | ORAL | Status: DC | PRN

## 2014-05-24 MED ORDER — BENZOCAINE-MENTHOL 20-0.5 % EX AERO
1.0000 "application " | INHALATION_SPRAY | CUTANEOUS | Status: DC | PRN
  Administered 2014-05-24: 1 via TOPICAL
  Filled 2014-05-24: qty 56

## 2014-05-24 MED ORDER — FENTANYL CITRATE 0.05 MG/ML IJ SOLN: 100.0000 ug | INTRAMUSCULAR | Status: DC | PRN

## 2014-05-24 MED ORDER — OXYCODONE-ACETAMINOPHEN 5-325 MG PO TABS: 1.0000 | ORAL_TABLET | ORAL | Status: DC | PRN

## 2014-05-24 MED ORDER — MAGNESIUM HYDROXIDE 400 MG/5ML PO SUSP: 30.0000 mL | ORAL | Status: DC | PRN

## 2014-05-24 MED ORDER — WITCH HAZEL-GLYCERIN EX PADS: 1.0000 "application " | MEDICATED_PAD | CUTANEOUS | Status: DC | PRN

## 2014-05-24 NOTE — Progress Notes (Signed)
UR completed 

## 2014-05-24 NOTE — Progress Notes (Signed)
I assisted Heather in L&D and Lupita Leash RN MB with questions. Eda H Royal Interpreter.

## 2014-05-24 NOTE — H&P (Signed)
Attestation of Attending Supervision of Advanced Practitioner (CNM/NP): Evaluation and management procedures were performed by the Advanced Practitioner under my supervision and collaboration.  I have reviewed the Advanced Practitioner's note and chart, and I agree with the management and plan.  HARRAWAY-SMITH, Metzli Pollick 11:32 PM     

## 2014-05-24 NOTE — Plan of Care (Signed)
Problem: Consults Goal: Postpartum Patient Education (See Patient Education module for education specifics.)  Outcome: Progressing With spanish inturpreter

## 2014-05-24 NOTE — Lactation Note (Signed)
This note was copied from the chart of Janice Alora Gorey. Lactation Consultation Note  Patient Name: Janice Chase [id number] Date: 05/24/2014 Reason for consult: Initial assessment of this Spanish-speaking multipara and her newborn at 12 hours postpartum. Mom is experienced breastfeeding mother who nursed 3 older children for 2 weeks to 2 months and for one baby (born premature and in NICU) mom states that she pumped for 2-3 months and fed that baby ebm.  FOB speaks English and is interpreting for LC.  Baby has been breastfeeding for about 15 minutes per feeding and consistent LATCH scores=8.  Just after LC visit, RN, Waynetta Sandy and interpreter from [facility] went into room to review basic maternal/infant education, including a review of hand expression.  LC reviewed supply and demand and encouraged cue feedings. LC encouraged review of Baby and Me pp 13-16 for review of BF information in [place].LC provided [employer] brochure in Spanish, and reviewed WH services and list of community and web site resources, especially [employer] website which has information available in [place]..    Maternal Data Has patient been taught Hand Expression?: Yes (RN has reviewed with [facility] interpreter - see maternal education) Does the patient have breastfeeding experience prior to this delivery?: Yes  Feeding Feeding Type: Breast Fed Length of feed: 15 min  LATCH Score/Interventions Latch: Grasps breast easily, tongue down, lips flanged, rhythmical sucking.  Audible Swallowing: None Intervention(s): Skin to skin  Type of Nipple: Everted at rest and after stimulation  Comfort (Breast/Nipple): Soft / non-tender     Hold (Positioning): No assistance needed to correctly position infant at breast. Intervention(s): Breastfeeding basics reviewed;Support Pillows;Position options;Skin to skin  LATCH Score: 8  Lactation Tools Discussed/Used   Cue feedings Supply and demand (hand expression and STS reviewed by RN, Waynetta Sandy with [facility]  interpreter)  Consult Status Consult Status: Follow-up Date: 05/25/14 Follow-up type: In-patient    Warrick Parisian [facility] 05/24/2014, 10:40 PM

## 2014-05-24 NOTE — H&P (Signed)
HPI: Janice Chase is a 33 y.o. year old [id number] female at [redacted]w[redacted]d weeks gestation who presents to MAU reporting Spontaneous rupture of membranes Labor since 0730. Started care at [facility] and [facility] to Hx PTD. Was on 17-P this pregnancy. Pelvis proven to 7-2. Hx C/S x 1 for breech follewed by 3 VBACs. Had planned repeat in order to get BTL done during C/S but arrived in MAU 8 cm.   Office  [facility] Dating: LMP + 20 wks U/S  Genetic Screen  Quad normal, CF neg   Anatomic [place]  Nml at 26 wks  Glucose Screen  Early 68 3rd Trim 97  TDaP  03/01/14  GBS  Negative  Feeding Preference  Breast and bottle  Contraception  BTL  Circumcision  No  Peds  [facility]  Support person  Husband     Maternal Medical History:  Reason for admission: Nausea.    OB History   Grav Para Term Preterm Abortions TAB SAB Ect Mult Living   0 0 0 0 0 4     Past Medical History  Diagnosis Date  . No pertinent past medical history   . Abnormal Pap smear   . H/O cesarean section   . Hx successful VBAC (vaginal birth after cesarean), currently pregnant    Past Surgical History  Procedure Laterality Date  . Cesarean section     Family History: family history includes Diabetes in her father. There is no history of Other. Social History:  reports that she has quit smoking. She quit smokeless tobacco use about 9 years ago. She reports that she does not drink alcohol or use illicit drugs.   Prenatal Transfer Tool  Maternal Diabetes: No Genetic Screening: Normal Maternal Ultrasounds/Referrals: Normal Fetal Ultrasounds or other Referrals:  None Maternal Substance Abuse:  No Significant Maternal Medications:  None Significant Maternal Lab Results:  Lab values include: Group B Strep negative Other Comments:  None  Review of Systems  Constitutional: Negative for fever and chills.  Eyes: Negative for blurred vision and double vision.  Gastrointestinal: Positive for abdominal pain  (contractions only). Negative for nausea and vomiting.  Neurological: Positive for dizziness (last night). Negative for headaches.    Dilation: 10 Effacement (%): 100 Station: 0 Exam by:: Marielle Mantione, cnm Blood pressure 105/60, pulse 84, temperature 97.5 F (36.4 C), temperature source Oral, resp. rate 20, last menstrual period 08/25/2013. Maternal Exam:  Uterine Assessment: Contraction strength is firm.  Contraction frequency is irregular.   Abdomen: Estimated fetal weight is 9 lb.   Fetal presentation: vertex  Introitus: Normal vulva. Amniotic fluid character: clear. Grossly ruptured  Pelvis: adequate for delivery.   Cervix: Cervix evaluated by digital exam.     Fetal Exam Fetal Monitor Review: Mode: ultrasound.   Baseline rate: 140.  Variability: moderate (6-25 bpm).   Pattern: accelerations present and variable decelerations.   Mild variables  Fetal State Assessment: Category I - tracings are normal.     Physical Exam  Nursing note and vitals reviewed. Constitutional: She is oriented to person, place, and time. She appears well-developed and well-nourished. She appears distressed.  HENT:  Head: Normocephalic.  Eyes: Conjunctivae are normal.  Cardiovascular: Normal rate, regular rhythm and normal heart sounds.   Respiratory: Effort normal and breath sounds normal. No respiratory distress.  GI: Soft. There is no tenderness.  Genitourinary: Vagina normal.  Musculoskeletal: She exhibits no edema and no tenderness.  Neurological: She is alert and oriented  to person, place, and time. She has normal reflexes.  Skin: Skin is warm and dry.  Psychiatric: She has a normal mood and affect.    Prenatal labs: ABO, Rh: --/--/O POS (09/17 1610) Antibody: NEG (09/17 9604) Rubella: Immune (04/06 0000) RPR: NON REAC (06/25 1013)  HBsAg: Negative (04/06 0000)  HIV: NONREACTIVE (06/25 1013)  GBS: Negative (08/27 0000)   Assessment: 1. Labor: Transition 2. Fetal Wellbeing:  Category I-II  3. Pain Control: none 4. GBS: Neg 5. 38.6 week IUP 6. Prior D/S  Plan:  1. Admit to BS per consult with MD 2. Routine L&D orders 3. Analgesia/anesthesia PRN  4. Discussed increased rsk of Stat/Urgent C/S. Recommend proceeding w/ vaginal delivery. Pt agrees.  Janice Chase 05/24/2014, 9:50 AM

## 2014-05-25 ENCOUNTER — Inpatient Hospital Stay (HOSPITAL_COMMUNITY): Admission: RE | Admit: 2014-05-25 | Payer: Self-pay | Source: Ambulatory Visit | Admitting: Obstetrics and Gynecology

## 2014-05-25 ENCOUNTER — Encounter (HOSPITAL_COMMUNITY): Admission: RE | Payer: Self-pay | Source: Ambulatory Visit

## 2014-05-25 LAB — CBC
HEMATOCRIT: 33.9 % — AB (ref 36.0–46.0)
Hemoglobin: 11.2 g/dL — ABNORMAL LOW (ref 12.0–15.0)
MCH: 29 pg (ref 26.0–34.0)
MCHC: 33 g/dL (ref 30.0–36.0)
MCV: 87.8 fL (ref 78.0–100.0)
Platelets: 308 10*3/uL (ref 150–400)
RBC: 3.86 MIL/uL — ABNORMAL LOW (ref 3.87–5.11)
RDW: 14.7 % (ref 11.5–15.5)
WBC: 10.7 10*3/uL — ABNORMAL HIGH (ref 4.0–10.5)

## 2014-05-25 SURGERY — Surgical Case
Anesthesia: Regional | Site: Abdomen | Laterality: Bilateral

## 2014-05-25 MED ORDER — IBUPROFEN 600 MG PO TABS: 600.0000 mg | ORAL_TABLET | Freq: Four times a day (QID) | ORAL | Status: DC

## 2014-05-25 NOTE — Discharge Instructions (Signed)

## 2014-05-25 NOTE — Discharge Summary (Signed)
Attestation of Attending Supervision of Advanced Practitioner (CNM/NP): Evaluation and management procedures were performed by the Advanced Practitioner under my supervision and collaboration.  I have reviewed the Advanced Practitioner's note and chart, and I agree with the management and plan.  Samyuktha Brau 05/25/2014 5:48 PM

## 2014-05-25 NOTE — Lactation Note (Signed)
This note was copied from the chart of Janice Chase. Lactation Consultation Note  Eda interpreter present. Ex BF,  Mother states bf going well. Mom encouraged to feed baby 8-12 times/24 hours and with feeding cues.  Suggest mother call lactation or Maury Dus with the next feeding to view latch. Denies problems or questions at this time.  Patient Name: Janice Chase [id number] Date: 05/25/2014 Reason for consult: Follow-up assessment   Maternal Data    Feeding    LATCH Score/Interventions                      Lactation Tools Discussed/Used     Consult Status Consult Status: Follow-up Date: 05/26/14 Follow-up type: In-patient    Dahlia Byes [facility] 05/25/2014, 11:08 AM

## 2014-05-25 NOTE — Progress Notes (Signed)
[facility] with questions. Eda H Royal  Interpreter.

## 2014-05-25 NOTE — Lactation Note (Signed)
This note was copied from the chart of Janice Maidie Streight. Lactation Consultation Note  FOB interpreted.  Suggest they unwrap baby for feeding. Mother placed baby in cradle hold.  Sucks and swallows observed. LS8. Provided pillow support for mother. Encouraged her to bring baby to her and not leaning forward.  Patient Name: Janice Chase [id number] Date: 05/25/2014 Reason for consult: Follow-up assessment   Maternal Data    Feeding Feeding Type: Breast Fed  LATCH Score/Interventions Latch: Grasps breast easily, tongue down, lips flanged, rhythmical sucking.  Audible Swallowing: A few with stimulation  Type of Nipple: Everted at rest and after stimulation  Comfort (Breast/Nipple): Soft / non-tender     Hold (Positioning): Assistance needed to correctly position infant at breast and maintain latch.  LATCH Score: 8  Lactation Tools Discussed/Used     Consult Status Consult Status: PRN Date: 05/26/14 Follow-up type: In-patient    Dahlia Byes [facility] 05/25/2014, 12:41 PM

## 2014-05-25 NOTE — Discharge Summary (Signed)
Obstetric Discharge Summary Reason for Admission: onset of labor Prenatal Procedures: none Intrapartum Procedures: spontaneous vaginal delivery Postpartum Procedures: none Complications-Operative and Postpartum: none Hemoglobin  Date Value Ref Range Status  05/25/2014 11.2* 12.0 - 15.0 g/dL Final  10/14/2534 64.4   Final     HCT  Date Value Ref Range Status  05/25/2014 33.9* 36.0 - 46.0 % Final  12/11/2013 38   Final   Ms Sylvestre is a 33yo [id number] admitted in active labor/transition in the morning of 05/24/14. Her preg had been followed by the [facility] with tx to [facility] for hx of PTDs. The pt received 17P this preg. She has a hx of a C/S with VBAC x 3 and planned a rpt C/S this preg as she desired a BTL simultaneously. Her advanced labor status upon arrival only allowed for vag del, however. By PPD#1 she was doing well and was desirous of an early d/c. She was breastfeeding. Contraceptive plans to be made at her PP visit for either LARC/BTL.  Physical Exam:  General: alert, cooperative and no distress Heart: RRR Lungs: nl effort Lochia: appropriate Uterine Fundus: firm DVT Evaluation: No evidence of DVT seen on physical exam.  Discharge Diagnoses: Term Pregnancy-delivered  Discharge Information: Date: 05/25/2014 Activity: pelvic rest Diet: routine Medications: PNV and Ibuprofen Condition: stable Instructions: refer to practice specific booklet Discharge to: home Follow-up Information   Follow up with [facility]. Schedule an appointment as soon as possible for a visit in 6 weeks. (For your postpartum appointment.)    Contact information:   [address] [place] [address] [phone]      Newborn Data: Live born female  Birth Weight: 7 lb 7.6 oz (3390 g) APGAR: 9, 9  Home with mother.  Cam Hai CNM 05/25/2014, 12:47 PM

## 2014-05-25 NOTE — Progress Notes (Addendum)
I have seen and examined this patient and I agree with the above. She desires d/c today if infant is able to go. See d/c summary. Questions answered. Cam Hai CNM 12:46 PM 05/25/2014

## 2014-05-25 NOTE — Progress Notes (Signed)
Post Partum Day 1 Subjective:  Janice Chase is a 33 y.o. [phone] [redacted]w[redacted]d s/p normal spontaneous vaginal delivery.  No acute events overnight.  Pt denies problems with ambulating, voiding or po intake.  She denies nausea or vomiting.  Pain is moderately controlled.  She has had flatus. She has not had bowel movement.  Lochia Small.  Plan for birth control is undecided.  Method of Feeding: Breast/Bottle  Objective: Blood pressure 99/43, pulse 61, temperature 98.1 F (36.7 C), temperature source Oral, resp. rate 16, last menstrual period 08/25/2013, SpO2 99.00%, unknown if currently breastfeeding.  Physical Exam:  General: alert, cooperative and no distress Lochia:normal flow Chest: CTAB Heart: RRR no m/r/g Abdomen: +BS, soft, nontender,  Uterine Fundus: firm, below the umbilicus DVT Evaluation: No evidence of DVT seen on physical exam. Extremities: no peripheral edema   Recent Labs  05/24/14 0834 05/25/14 0603  HGB 12.6 11.2*  HCT 37.9 33.9*    Assessment/Plan:  ASSESSMENT: Janice Chase is a 33 y.o. [id number] [redacted]w[redacted]d s/p NSVD  Plan for discharge tomorrow, Breastfeeding and Contraception undecided   LOS: 1 day   Azucena Cecil, PA-S2 05/25/2014, 7:56 AM

## 2014-06-04 ENCOUNTER — Telehealth (HOSPITAL_COMMUNITY): Payer: Self-pay | Admitting: Lactation Services

## 2014-06-04 NOTE — Telephone Encounter (Signed)
Dr., Domingo Sep from [facility] left message with Lactation Office to contact Janice Chase regarding scheduling an OP appointment due to painful latch and to help Mom obtain manual pump to use to empty breast till baby's latch is improved. [facility] called Mom using [employer] [phone] to interpret. Mom reports some experience with Breastfeeding 3 older children for 1-2 months. However, she reports nipple pain with this baby at the breast. LC advised Mom to apply EBM to sore nipples. Offered OP appointment Wednesday, 06/06/14 but Mom declined due to another appointment. Next available appointment is Friday, Oct. 1st at 2:30p. This was scheduled for Mom. Mom to come to Education entrance of [facility] and [facility] will have manual hand pump for Mom to pick up to use. Encouraged Mom to continue to BF with each feeding. If baby fussy at the breast, pre-pump to get her milk flow going/soften aerola to see if this will help Mom to latch. If Mom cannot latch baby then advised to pump every 3 hours for 15 minutes to protect milk supply. Mom denies other questions. Second interpreter used to finish call. Vicente Serene [phone].

## 2014-06-08 ENCOUNTER — Ambulatory Visit (HOSPITAL_COMMUNITY): Admission: RE | Admit: 2014-06-08 | Payer: MEDICAID | Source: Ambulatory Visit

## 2014-07-04 ENCOUNTER — Ambulatory Visit: Payer: Self-pay | Admitting: Obstetrics & Gynecology

## 2014-07-09 ENCOUNTER — Encounter (HOSPITAL_COMMUNITY): Payer: Self-pay | Admitting: *Deleted

## 2014-07-18 ENCOUNTER — Encounter: Payer: Self-pay | Admitting: General Practice

## 2014-07-18 ENCOUNTER — Ambulatory Visit: Payer: Self-pay | Admitting: Obstetrics & Gynecology

## 2014-07-18 ENCOUNTER — Telehealth: Payer: Self-pay | Admitting: General Practice

## 2014-07-18 ENCOUNTER — Encounter: Payer: Self-pay | Admitting: Obstetrics & Gynecology

## 2014-07-18 NOTE — Telephone Encounter (Signed)
Patient no showed for pp visit. Called patient with pac[employer] interpreter [phone], no answer- left message that we are calling because it appears you missed an appt in our office today please call our front office staff to reschedule this appt. Will send letter

## 2014-07-22 IMAGING — US US OB COMP +14 WK
2 series · 12 of 28 positions shown · non-contrast
Comparison: none

[Series 1: us ob comp +14 wk · 9 of 92 slices shown (1 of 2)]
[im 5/92]
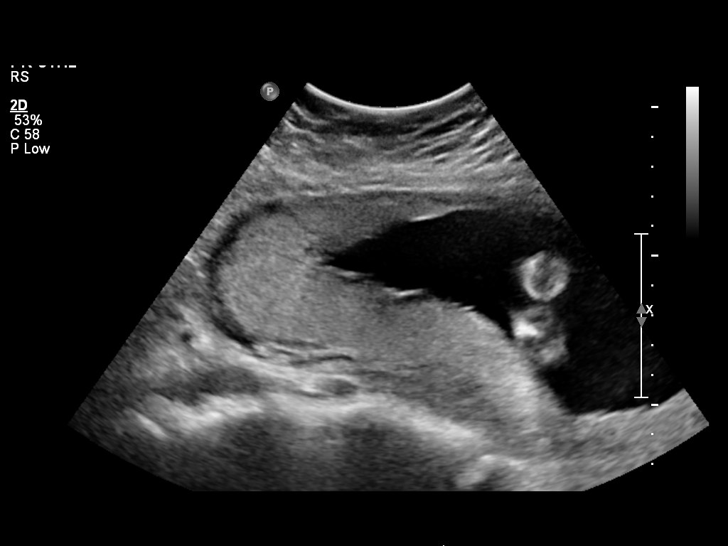
[im 14/92]
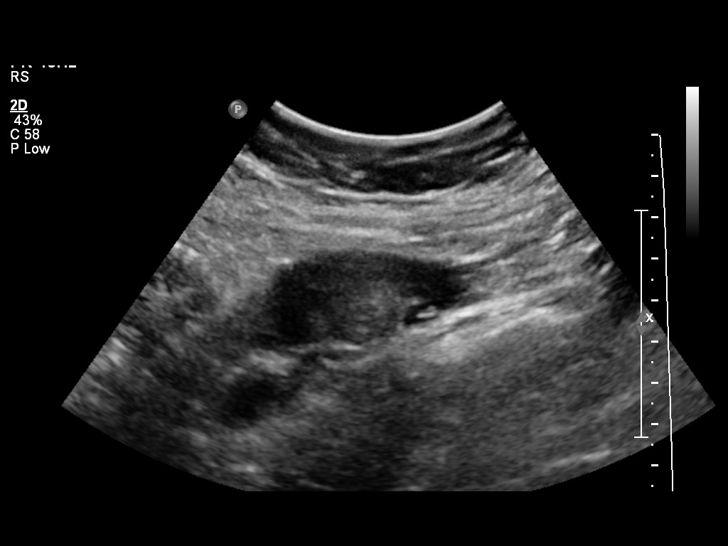
[im 22/92]
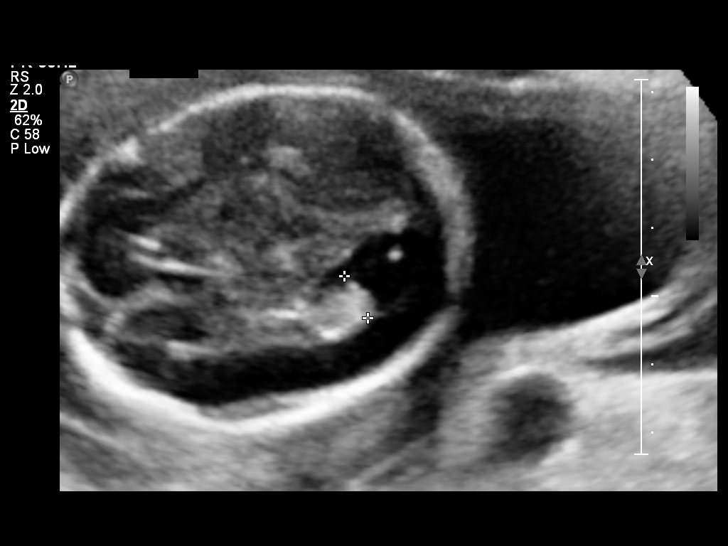
[im 35/92]
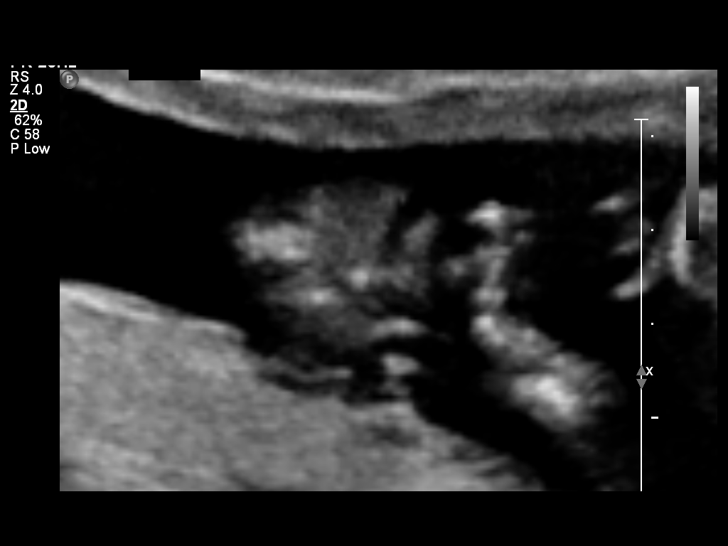
[im 44/92]
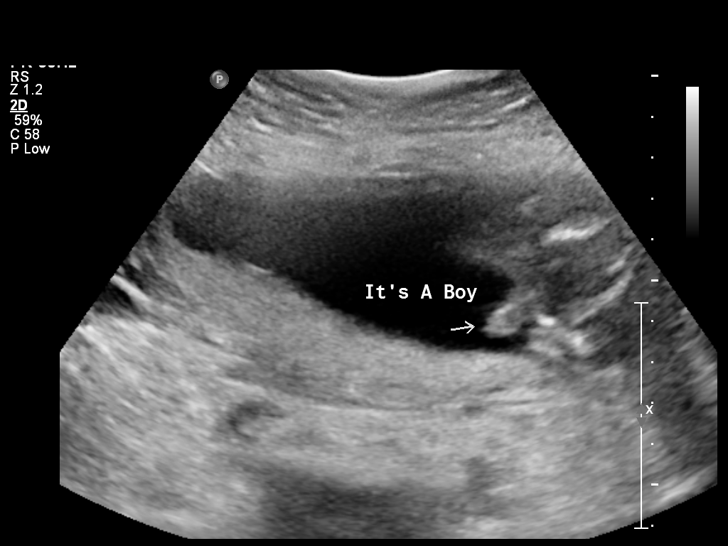
[im 53/92]
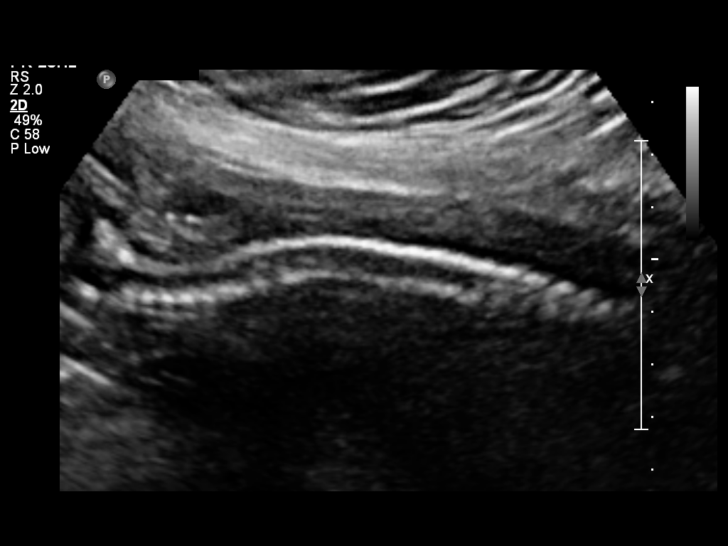
[im 66/92]
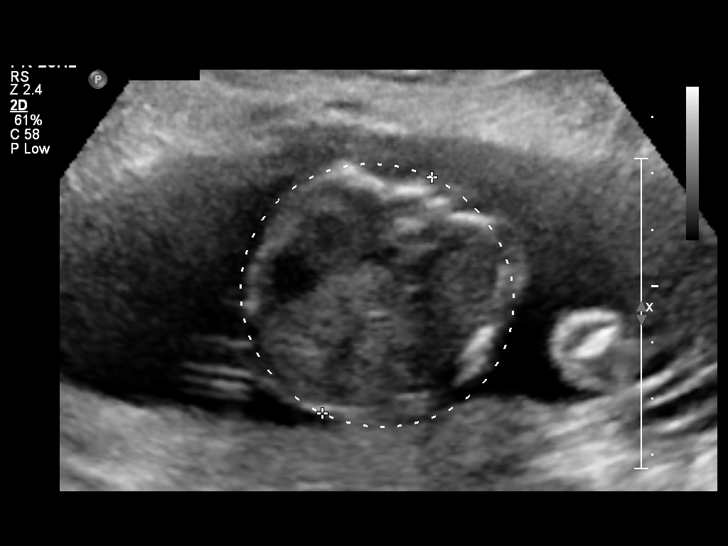
[im 74/92]
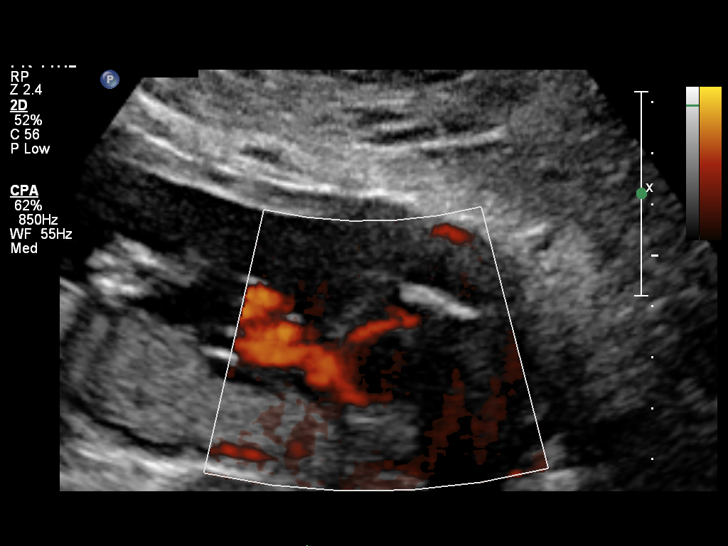
[im 83/92]
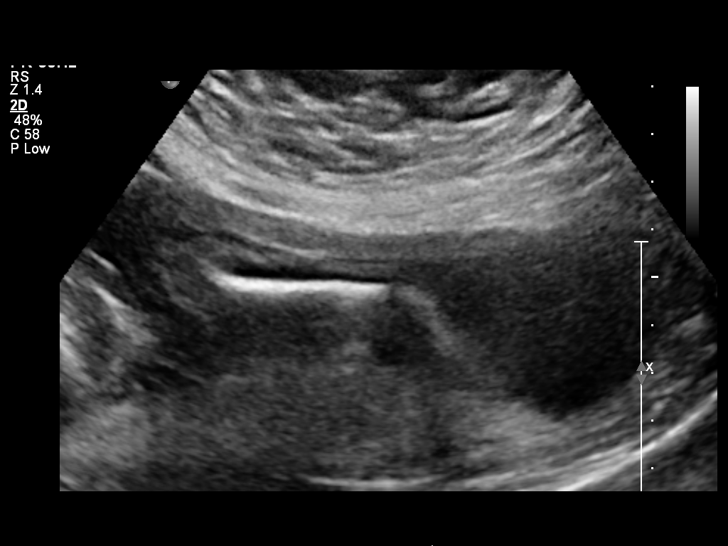

[Series 1: us ob comp +14 wk · 27 acquisitions, 3 frames shown (2 of 2)]
[im 1/27]
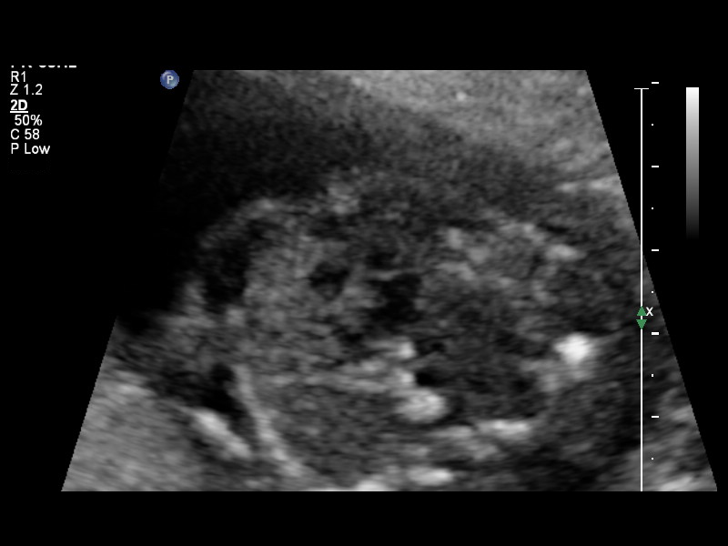
[im 11/27]
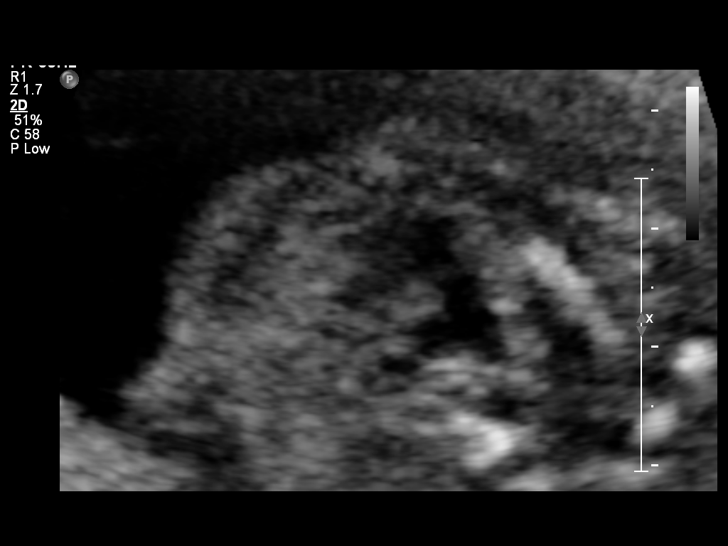
[im 21/27]
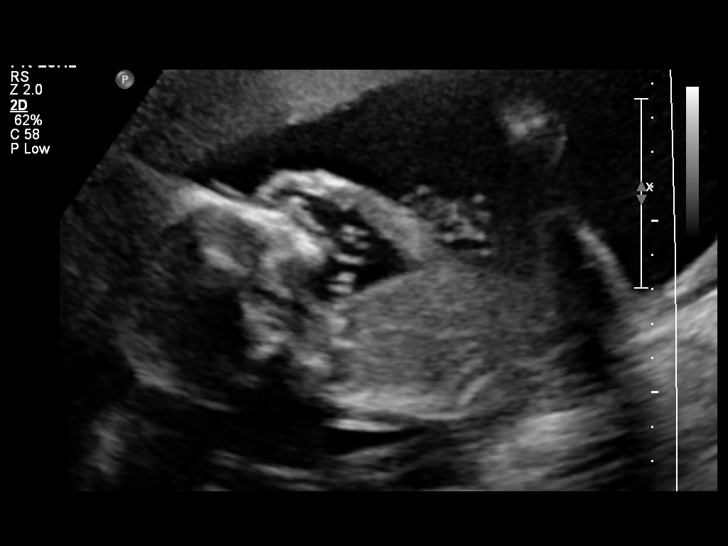

[12 of 28 positions shown; findings below may reference images not displayed]

OBSTETRICS REPORT
                      (Signed Final 01/15/2014 [DATE])

Service(s) Provided

 US OB COMP + 14 WK                                    76805.1
Indications

 Basic anatomic survey
Fetal Evaluation

 Num Of Fetuses:    1
 Fetal Heart Rate:  140                          bpm
 Cardiac Activity:  Observed
 Presentation:      Breech
 Placenta:          Right lateral, above
                    cervical os
 P. Cord            Visualized, central
 Insertion:

 Amniotic Fluid
 AFI FV:      Subjectively within normal limits
                                             Larg Pckt:    3.75  cm
Biometry

 BPD:     46.9  mm     G. Age:  20w 1d                CI:        68.71   70 - 86
                                                      FL/HC:      18.3   16.8 -

 HC:     180.8  mm     G. Age:  20w 3d       45  %    HC/AC:      1.21   1.09 -

 AC:     149.3  mm     G. Age:  20w 1d       36  %    FL/BPD:
 FL:        33  mm     G. Age:  20w 2d       38  %    FL/AC:      22.1   20 - 24
 HUM:     31.5  mm     G. Age:  20w 4d       52  %
 CER:       20  mm     G. Age:  19w 0d       18  %

 Est. FW:     343  gm    0 lb 12 oz      44  %
Gestational Age

 LMP:           20w 3d        Date:  08/25/13                 EDD:   06/01/14
 U/S Today:     20w 2d                                        EDD:   06/02/14
 Best:          20w 3d     Det. By:  LMP  (08/25/13)          EDD:   06/01/14
Anatomy

 Cranium:          Appears normal         Aortic Arch:      Appears normal
 Fetal Cavum:      Appears normal         Ductal Arch:      Appears normal
 Ventricles:       Appears normal         Diaphragm:        Appears normal
 Choroid Plexus:   Appears normal         Stomach:          Appears normal, left
                                                            sided
 Cerebellum:       Appears normal         Abdomen:          Appears normal
 Posterior Fossa:  Appears normal         Abdominal Wall:   Not well visualized
 Nuchal Fold:      Not applicable (>20    Cord Vessels:     Appears normal (3
                   wks GA)                                  vessel cord)
 Face:             Not well visualized    Kidneys:          Appear normal
 Lips:             Not well visualized    Bladder:          Appears normal
 Heart:            Appears normal         Spine:            Appears normal
                   (4CH, axis, and
                   situs)
 RVOT:             Not well visualized    Lower             Appears normal
                                          Extremities:
 LVOT:             Not well visualized    Upper             Appears normal
                                          Extremities:

 Other:  Fetus appears to be a male. Nasal bone visualized. Technically
         difficult due to maternal habitus and fetal position.
Targeted Anatomy

 Fetal Central Nervous System
 Lat. Ventricles:  7.0                    Cisterna Magna:
Cervix Uterus Adnexa

 Cervical Length:    3.5      cm

 Cervix:       Normal appearance by transabdominal scan.
 Uterus:       No abnormality visualized.
 Cul De Sac:   No free fluid seen.

 Left Ovary:    Not visualized.
 Right Ovary:   Within normal limits.

 Adnexa:     No adnexal mass visualized.
Impression

 SIUP at 20+3 weeks
 Normal detailed fetal anatomy; limited views of face, outflow
 tracts and CI
 Markers of aneuploidy: none
 Normal amniotic fluid volume
 Measurements consistent with LMP dating
Recommendations

 Follow-up as clinically indicated or follow-up ultrasound in 6-8
 weeks to complete anatomy survey

 questions or concerns.

## 2018-07-01 ENCOUNTER — Ambulatory Visit: Payer: Self-pay | Admitting: Internal Medicine

## 2018-07-01 ENCOUNTER — Encounter: Payer: Self-pay | Admitting: Internal Medicine

## 2018-07-01 VITALS — BP 112/72 | HR 70 | Resp 12 | Ht <= 58 in | Wt 186.0 lb

## 2018-07-01 DIAGNOSIS — H547 Unspecified visual loss: Secondary | ICD-10-CM

## 2018-07-01 DIAGNOSIS — F329 Major depressive disorder, single episode, unspecified: Secondary | ICD-10-CM

## 2018-07-01 DIAGNOSIS — K0889 Other specified disorders of teeth and supporting structures: Secondary | ICD-10-CM

## 2018-07-01 DIAGNOSIS — Z6841 Body Mass Index (BMI) 40.0 and over, adult: Secondary | ICD-10-CM

## 2018-07-01 NOTE — Patient Instructions (Addendum)
[facility]  Directions  Website Address: [address] [place], [place] [address]   Tome un vaso de agua antes de cada comida Tome un minimo de 6 a 8 vasos de agua diarios Coma tres veces al dia Coma Neomia Dear proteina y Neomia Dear grasa saludable con comida.  (huevos, pescado, pollo, pavo, y limite carnes rojas Coma 5 porciones diarias de legumbres.  Mezcle los colores Coma 2 porciones diarias de frutas con cascara cuando sea comestible Use platos pequeos Suelte su tenedor o cuchara despues de cada mordida hata que se mastique y se trague Come en la mesa con amigos o familiares por lo menos una vez al dia Apague la televisin y aparatos electrnicos durante la comida  Su objetivo debe ser perder una libra por semana  Estudios recientes indican que las personas quienes consumen todos de sus calorias durante 12 horas se bajan de pesocon Mas eficiencia.  Por ejemplo, si Usted come su primera comida a las 7:00 a.m., su comida final del dia se debe completar antes de las 7:00 p.m.
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
## 2018-07-01 NOTE — Progress Notes (Signed)
Social Work Intern Barrister's clerk assessed patient for mental health and social determinants of health (SDOH). Janice Chase stated that she has been feeling depressed and crying a lot in the past two weeks. Janice Chase also stated that she has been feeling fatigue and stressed. She denied having any suicidal thoughts. Janice Chase also denied having any issues with SDOH at this time. She is interested in counseling, but not at this time, SWI will follow up with her.

## 2018-07-02 LAB — CBC WITH DIFFERENTIAL/PLATELET
Basophils Absolute: 0 10*3/uL (ref 0.0–0.2)
Basos: 0 %
EOS (ABSOLUTE): 0.1 10*3/uL (ref 0.0–0.4)
Eos: 1 %
Hematocrit: 41.1 % (ref 34.0–46.6)
Hemoglobin: 13.4 g/dL (ref 11.1–15.9)
IMMATURE GRANULOCYTES: 0 %
Immature Grans (Abs): 0 10*3/uL (ref 0.0–0.1)
Lymphocytes Absolute: 2.2 10*3/uL (ref 0.7–3.1)
Lymphs: 22 %
MCH: 30.1 pg (ref 26.6–33.0)
MCHC: 32.6 g/dL (ref 31.5–35.7)
MCV: 92 fL (ref 79–97)
Monocytes Absolute: 0.6 10*3/uL (ref 0.1–0.9)
Monocytes: 7 %
Neutrophils Absolute: 6.8 10*3/uL (ref 1.4–7.0)
Neutrophils: 70 %
Platelets: 430 10*3/uL (ref 150–450)
RBC: 4.45 x10E6/uL (ref 3.77–5.28)
RDW: 12.9 % (ref 12.3–15.4)
WBC: 9.8 10*3/uL (ref 3.4–10.8)

## 2018-07-02 LAB — COMPREHENSIVE METABOLIC PANEL
ALT: 14 IU/L (ref 0–32)
AST: 15 IU/L (ref 0–40)
Albumin/Globulin Ratio: 1.5 (ref 1.2–2.2)
Albumin: 4.3 g/dL (ref 3.5–5.5)
Alkaline Phosphatase: 65 IU/L (ref 39–117)
BILIRUBIN TOTAL: 0.4 mg/dL (ref 0.0–1.2)
BUN/Creatinine Ratio: 16 (ref 9–23)
BUN: 10 mg/dL (ref 6–20)
CALCIUM: 9.9 mg/dL (ref 8.7–10.2)
CHLORIDE: 102 mmol/L (ref 96–106)
CO2: 22 mmol/L (ref 20–29)
Creatinine, Ser: 0.61 mg/dL (ref 0.57–1.00)
GFR calc Af Amer: 133 mL/min/{1.73_m2} (ref >59)
GFR calc non Af Amer: 115 mL/min/{1.73_m2} (ref >59)
Globulin, Total: 2.8 g/dL (ref 1.5–4.5)
Glucose: 79 mg/dL (ref 65–99)
Potassium: 4.6 mmol/L (ref 3.5–5.2)
Sodium: 141 mmol/L (ref 134–144)
Total Protein: 7.1 g/dL (ref 6.0–8.5)

## 2018-07-02 LAB — TSH: TSH: 1.24 u[IU]/mL (ref 0.450–4.500)

## 2018-09-21 ENCOUNTER — Ambulatory Visit: Payer: Self-pay | Admitting: Internal Medicine

## 2018-10-08 ENCOUNTER — Encounter: Payer: Self-pay | Admitting: Internal Medicine

## 2021-08-20 ENCOUNTER — Other Ambulatory Visit: Payer: Self-pay | Admitting: Obstetrics and Gynecology

## 2021-08-20 DIAGNOSIS — Z1231 Encounter for screening mammogram for malignant neoplasm of breast: Secondary | ICD-10-CM

## 2021-09-18 ENCOUNTER — Other Ambulatory Visit: Payer: Self-pay

## 2021-09-18 ENCOUNTER — Ambulatory Visit
Admission: RE | Admit: 2021-09-18 | Discharge: 2021-09-18 | Disposition: A | Discharge status: Home / Self Care | Payer: Self-pay | Source: Ambulatory Visit | Attending: Obstetrics and Gynecology | Admitting: Obstetrics and Gynecology

## 2021-09-18 ENCOUNTER — Ambulatory Visit: Payer: No Typology Code available for payment source | Admitting: *Deleted

## 2021-09-18 VITALS — BP 108/64 | Wt 203.4 lb

## 2021-09-18 DIAGNOSIS — Z1239 Encounter for other screening for malignant neoplasm of breast: Secondary | ICD-10-CM

## 2021-09-18 DIAGNOSIS — Z1231 Encounter for screening mammogram for malignant neoplasm of breast: Secondary | ICD-10-CM

## 2021-09-18 NOTE — Progress Notes (Signed)
[name] is a 41 y.o. female who presents to [facility] clinic today with no complaints.    Pap Smear: Pap smear not completed today. Last Pap smear was 08/12/2021 at [facility] and was normal with negative HPV per [facility], LPN. Per patient has history of an abnormal Pap smear. Last Pap smear result is not available in Epic.   Physical exam: Breasts Breasts symmetrical. No skin abnormalities bilateral breasts. No nipple retraction bilateral breasts. No nipple discharge bilateral breasts. No lymphadenopathy. No lumps palpated bilateral breasts. No complaints of pain or tenderness on exam.     Pelvic/Bimanual Pap is not indicated today per BCCCP guidelines.   Smoking History: Patient is a former smoker that quit 04/28/2005.   Patient Navigation: Patient education provided. Access to services provided for patient through [place] program. Spanish interpreter Janice Chase from [facility] provided.    Breast and Cervical Cancer Risk Assessment: Patient does not have family history of breast cancer, known genetic mutations, or radiation treatment to the chest before age 57. Per patient has history of cervical dysplasia. Patient has no history of being immunocompromised or DES exposure in-utero.  Risk Assessment     Risk Scores       09/18/2021   Last edited by: Narda Rutherford, LPN   5-year risk: 0.6 %   Lifetime risk: 6.8 %            A: BCCCP exam without pap smear No complaints.  P: Referred patient to the [facility] for a screening mammogram on mobile unit. Appointment scheduled Thursday, September 18, 2021 at 0920.  Priscille Heidelberg, RN 09/18/2021 8:51 AM

## 2021-09-18 NOTE — Patient Instructions (Signed)
Explained breast self awareness with Dena Billet. Patient did not need a Pap smear today due to last Pap smear and HPV typing was 08/12/2021. Let her know BCCCP will cover Pap smears every 3 years unless has a history of abnormal Pap smears. Referred patient to the [facility] for a screening mammogram on mobile unit. Appointment scheduled Thursday, September 18, 2021 at 0920. Patient escorted to the mobile unit following BCCCP appointment for her screening mammogram. Let patient know the Breast Center will follow up with her within the next couple weeks with results of her mammogram by letter or phone. Janice Chase verbalized understanding.  Flem Enderle, Kathaleen Maser, RN 8:51 AM

## 2021-09-23 ENCOUNTER — Other Ambulatory Visit: Payer: Self-pay | Admitting: Obstetrics and Gynecology

## 2021-09-23 DIAGNOSIS — R928 Other abnormal and inconclusive findings on diagnostic imaging of breast: Secondary | ICD-10-CM

## 2021-09-25 ENCOUNTER — Ambulatory Visit
Admission: RE | Admit: 2021-09-25 | Discharge: 2021-09-25 | Disposition: A | Discharge status: Home / Self Care | Payer: No Typology Code available for payment source | Source: Ambulatory Visit | Attending: Obstetrics and Gynecology | Admitting: Obstetrics and Gynecology

## 2021-09-25 ENCOUNTER — Other Ambulatory Visit: Payer: Self-pay | Admitting: Obstetrics and Gynecology

## 2021-09-25 DIAGNOSIS — R921 Mammographic calcification found on diagnostic imaging of breast: Secondary | ICD-10-CM

## 2021-09-25 DIAGNOSIS — R928 Other abnormal and inconclusive findings on diagnostic imaging of breast: Secondary | ICD-10-CM

## 2022-03-26 ENCOUNTER — Other Ambulatory Visit: Payer: Self-pay | Admitting: Obstetrics and Gynecology

## 2022-03-26 ENCOUNTER — Ambulatory Visit
Admission: RE | Admit: 2022-03-26 | Discharge: 2022-03-26 | Disposition: A | Discharge status: Home / Self Care | Payer: No Typology Code available for payment source | Source: Ambulatory Visit | Attending: Obstetrics and Gynecology | Admitting: Obstetrics and Gynecology

## 2022-03-26 DIAGNOSIS — R921 Mammographic calcification found on diagnostic imaging of breast: Secondary | ICD-10-CM

## 2022-05-22 ENCOUNTER — Ambulatory Visit: Payer: No Typology Code available for payment source

## 2022-09-24 ENCOUNTER — Ambulatory Visit: Payer: Self-pay | Admitting: Hematology and Oncology

## 2022-09-24 VITALS — Wt 203.0 lb

## 2022-09-24 DIAGNOSIS — Z1239 Encounter for other screening for malignant neoplasm of breast: Secondary | ICD-10-CM

## 2022-09-24 NOTE — Progress Notes (Deleted)
[place] about BSE and gave educational materials to take home. Patient did not need a Pap smear today due to last Pap smear was in 2022 per patient. Let her know BCCCP will cover Pap smears every 5 years unless has a history of abnormal Pap smears. Referred patient to the [place] for screening mammogram. Appointment scheduled for 10/07/22. Patient aware of appointment and will be there. Let patient know will follow up with her within the next couple weeks with results. Janice Chase verbalized understanding.  [name], NP 09/24/22 2:34 PM

## 2022-09-24 NOTE — Progress Notes (Signed)
Ms. VICTOR GRANADOS is a 42 y.o. female who presents to [facility] clinic today with no complaints.    Pap Smear: Pap not smear completed today. Last Pap smear was 2022 and was normal. Per patient has history of an abnormal Pap smear. Last Pap smear result is available in Epic. 2010; High grade lesion; 2014 Negative/ HPV-;    Physical exam: Breasts Breasts symmetrical. No skin abnormalities bilateral breasts. No nipple retraction bilateral breasts. No nipple discharge bilateral breasts. No lymphadenopathy. No lumps palpated bilateral breasts.       Pelvic/Bimanual Pap is not indicated today    Smoking History: Patient has never smoked and was not referred to quit line.    Patient Navigation: Patient education provided. Access to services provided for patient through [place] interpreter provided. No transportation provided   Colorectal Cancer Screening: Per patient has never had colonoscopy completed No complaints today.    Breast and Cervical Cancer Risk Assessment: Patient does not have family history of breast cancer, known genetic mutations, or radiation treatment to the chest before age 49. Patient has history of cervical dysplasia, immunocompromised, or DES exposure in-utero.  Risk Scores as of 09/24/2022     Baker Janus           5-year 0.56 %   Lifetime 7.81 %   This patient is [place]/Latina but has no documented birth country, so the [place] used data from [place] patients to calculate their risk score. Document a birth country in the Demographics activity for a more accurate score.         Last calculated by Claretha Cooper, CMA on 09/24/2022 at  2:10 PM        A: BCCCP exam without pap smear No complaints with benign exam.   P: Referred patient to the [facility] for a diagnostic mammogram. Appointment scheduled 10/07/22  [name], NP 09/24/2022 2:38 PM

## 2022-10-07 ENCOUNTER — Ambulatory Visit
Admission: RE | Admit: 2022-10-07 | Discharge: 2022-10-07 | Disposition: A | Discharge status: Home / Self Care | Payer: No Typology Code available for payment source | Source: Ambulatory Visit | Attending: Obstetrics and Gynecology | Admitting: Obstetrics and Gynecology

## 2022-10-07 DIAGNOSIS — R921 Mammographic calcification found on diagnostic imaging of breast: Secondary | ICD-10-CM

## 2023-08-23 ENCOUNTER — Other Ambulatory Visit: Payer: Self-pay

## 2023-08-23 DIAGNOSIS — R921 Mammographic calcification found on diagnostic imaging of breast: Secondary | ICD-10-CM

## 2023-10-14 ENCOUNTER — Ambulatory Visit: Payer: Self-pay | Admitting: *Deleted

## 2023-10-14 ENCOUNTER — Ambulatory Visit
Admission: RE | Admit: 2023-10-14 | Discharge: 2023-10-14 | Disposition: A | Discharge status: Home / Self Care | Payer: No Typology Code available for payment source | Source: Ambulatory Visit | Attending: Obstetrics and Gynecology | Admitting: Obstetrics and Gynecology

## 2023-10-14 VITALS — BP 117/55 | Wt 199.0 lb

## 2023-10-14 DIAGNOSIS — N644 Mastodynia: Secondary | ICD-10-CM

## 2023-10-14 DIAGNOSIS — R921 Mammographic calcification found on diagnostic imaging of breast: Secondary | ICD-10-CM

## 2023-10-14 DIAGNOSIS — Z1239 Encounter for other screening for malignant neoplasm of breast: Secondary | ICD-10-CM

## 2023-10-14 NOTE — Patient Instructions (Signed)
 Explained breast self awareness with Janice Chase. Patient did not need a Pap smear today due to last Pap smear and HPV typing was 08/12/2021. Let her know that based on her history of an abnormal Pap smear that her next Pap smear would be due in December 2025. Referred patient to the [facility] for a diagnostic mammogram per recommendation. Appointment scheduled Thursday, October 14, 2023 at 1240. Patient aware of appointment and will be there. [name] verbalized understanding.  Kismet Facemire, Wanda Ship, RN 11:40 AM

## 2023-10-14 NOTE — Progress Notes (Signed)
 [name] is a 43 y.o. female who presents to [facility] clinic today with complaint of left nipple pain x 2 months that comes and goes. Patient rates the pain at a 8 out of 10. Patient had a diagnostic mammogram completed 10/07/2022 that a one year left breat diagnostic mammogram is recommended for follow up.   Pap Smear: Pap smear not completed today. Last Pap smear was 08/12/2021 at [facility] clinic and was normal with negative HPV. Per patient has history of an abnormal Pap smear 04/09/2009 and [facility] that a colposcopy was completed for follow up. Per patient all Pap smears have been normal since colposcopy and that she has had more than three Pap smears. Last Pap smear result is not available in Epic.   Physical exam: Breasts Breasts symmetrical. No skin abnormalities bilateral breasts. No nipple retraction bilateral breasts. No nipple discharge bilateral breasts. No lymphadenopathy. No lumps palpated bilateral breasts. Complaints of left nipple area pain on exam.  MS 3D DIAG MAMMO BILAT BR (aka MM) Result Date: 10/14/2023 CLINICAL DATA:  Two year interval follow-up of likely benign calcifications involving the UPPER OUTER QUADRANT of the LEFT breast. Annual evaluation, RIGHT breast. EXAM: DIGITAL DIAGNOSTIC BILATERAL MAMMOGRAM WITH TOMOSYNTHESIS AND CAD TECHNIQUE: Bilateral digital diagnostic mammography and breast tomosynthesis was performed. The images were evaluated with computer-aided detection. COMPARISON:  Previous exam(s). ACR Breast Density Category c: The breasts are heterogeneously dense, which may obscure small masses. FINDINGS: Full field CC and MLO views of both breasts and spot magnification CC and mediolateral views of the LEFT breast calcifications were obtained. RIGHT: No findings suspicious for malignancy. LEFT: No findings suspicious for malignancy. The 0.7 cm group of calcifications in the UPPER OUTER QUADRANT at middle depth are unchanged dating back to the  January, 2023 mammogram, confirming benignity; most of these calcifications demonstrate layering on the mediolateral view, indicating benign milk of calcium. IMPRESSION: No mammographic evidence of malignancy involving either breast. RECOMMENDATION: Screening mammogram in one year.(Code:[id number]) I have discussed the findings and recommendations with the patient. If applicable, a reminder letter will be sent to the patient regarding the next appointment. BI-RADS CATEGORY  2: Benign. Electronically Signed   By: Debby Satterfield M.D.   On: 10/14/2023 13:29   MS DIGITAL DIAG TOMO BILAT Result Date: 10/07/2022 CLINICAL DATA:  43 year old female presenting for 1 year follow-up probably benign left breast calcifications and for annual exam of the right breast. EXAM: DIGITAL DIAGNOSTIC BILATERAL MAMMOGRAM WITH TOMOSYNTHESIS TECHNIQUE: Bilateral digital diagnostic mammography and breast tomosynthesis was performed. COMPARISON:  Previous exam(s). ACR Breast Density Category c: The breast tissue is heterogeneously dense, which may obscure small masses. FINDINGS: Right breast: No suspicious mass, distortion, or microcalcifications are identified to suggest presence of malignancy. Left breast: Spot 2D magnification views of the left breast were performed in addition to standard views. There are stable grouped calcifications in the upper outer left breast. No new suspicious linear or branching forms. No new associated mass or distortion. There are no new suspicious findings elsewhere in the left breast. IMPRESSION: 1. Stable probably benign calcifications in the upper outer left breast. 2.  No mammographic evidence of malignancy in the right breast. RECOMMENDATION: Diagnostic bilateral mammogram in 1 year. I have discussed the findings and recommendations with the patient. If applicable, a reminder letter will be sent to the patient regarding the next appointment. BI-RADS CATEGORY  3: Probably benign. Electronically Signed    By: Inocente Ast M.D.   On:  10/07/2022 12:35  MM DIAG BREAST TOMO UNI LEFT Result Date: 03/26/2022 CLINICAL DATA:  First six-month follow-up for probably benign calcifications in the LEFT breast. EXAM: DIGITAL DIAGNOSTIC UNILATERAL LEFT MAMMOGRAM WITH TOMOSYNTHESIS AND CAD TECHNIQUE: Left digital diagnostic mammography and breast tomosynthesis was performed. The images were evaluated with computer-aided detection. COMPARISON:  09/25/2021 and earlier ACR Breast Density Category c: The breast tissue is heterogeneously dense, which may obscure small masses. FINDINGS: Magnified views are performed of calcifications in the LATERAL portion of the LEFT breast. These views demonstrate a group of round and layering calcifications spanning 0.9 x 0 3 x 0.5 centimeters. The appearance is stable compared to prior study. Calcifications have no suspicious morphology or distribution. IMPRESSION: Stable probably benign LEFT breast calcifications. No mammographic evidence for malignancy. RECOMMENDATION: Bilateral diagnostic mammogram is recommended in 6 months. I have discussed the findings and recommendations with the patient. If applicable, a reminder letter will be sent to the patient regarding the next appointment. BI-RADS CATEGORY  3: Probably benign. Electronically Signed   By: Almarie Daring M.D.   On: 03/26/2022 10:48  MS DIGITAL DIAG UNI LEFT Result Date: 09/25/2021 CLINICAL DATA:  The patient was called back from her baseline screening mammography due to left breast calcifications. EXAM: DIGITAL DIAGNOSTIC UNILATERAL LEFT MAMMOGRAM WITH CAD TECHNIQUE: Left digital diagnostic mammography was performed. Mammographic images were processed with CAD. COMPARISON:  Previous exam(s). ACR Breast Density Category c: The breast tissue is heterogeneously dense, which may obscure small masses. FINDINGS: There appear to be 2 adjacent groups of calcifications in the upper outer left breast. One of the groups of calcifications  is round and punctate spanning 6 mm. The other group of calcifications demonstrates layering consistent with benign milk of calcium. IMPRESSION: There are 2 adjacent groups of calcifications in the upper-outer left breast. 1 of the groups demonstrates layering and is benign milk of calcium. The other 6 mm group is round and punctate and indeterminate but probably benign. RECOMMENDATION: Recommend six-month follow-up mammography of the probably benign upper outer quadrant left breast calcifications. I have discussed the findings and recommendations with the patient. If applicable, a reminder letter will be sent to the patient regarding the next appointment. BI-RADS CATEGORY  3: Probably benign. Electronically Signed   By: Alm Pouch III M.D.   On: 09/25/2021 10:46  MS DIGITAL SCREENING TOMO BILATERAL Result Date: 09/19/2021 CLINICAL DATA:  Screening. EXAM: DIGITAL SCREENING BILATERAL MAMMOGRAM WITH TOMOSYNTHESIS AND CAD TECHNIQUE: Bilateral screening digital craniocaudal and mediolateral oblique mammograms were obtained. Bilateral screening digital breast tomosynthesis was performed. The images were evaluated with computer-aided detection. COMPARISON:  None. ACR Breast Density Category c: The breast tissue is heterogeneously dense, which may obscure small masses. FINDINGS: In the left breast, calcifications warrant further evaluation. In the right breast, no findings suspicious for malignancy. IMPRESSION: Further evaluation is suggested for calcifications in the left breast. RECOMMENDATION: Diagnostic mammogram of the left breast. (Code:[id number]) The patient will be contacted regarding the findings, and additional imaging will be scheduled. BI-RADS CATEGORY  0: Incomplete. Need additional imaging evaluation and/or prior mammograms for comparison. Electronically Signed   By: Toribio Agreste M.D.   On: 09/19/2021 08:28        Pelvic/Bimanual Pap is not indicated today per BCCCP guidelines.   Smoking  History: Patient is a former smoker that quit 04/28/2005.   Patient Navigation: Patient education provided. Access to services provided for patient through [place] program. Spanish interpreter Bernice Angry from [facility] provided.   Breast and  Cervical Cancer Risk Assessment: Patient does not have family history of breast cancer, known genetic mutations, or radiation treatment to the chest before age 70. Per patient has history of cervical dysplasia. Patient has no history of being immunocompromised or DES exposure in-utero.   Risk Scores as of Encounter on 10/14/2023     Alisa           5-year 0.58%   Lifetime 8.58%            Last calculated by Silas, Ansyi K, CMA on 10/14/2023 at 11:33 AM        A: BCCCP exam without pap smear Complaint of left nipple pain.  P: Referred patient to the [facility] for a diagnostic mammogram per recommendation. Appointment scheduled Thursday, October 14, 2023 at 1240.  Driscilla Wanda SQUIBB, RN 10/14/2023 11:40 AM

## 2024-02-07 ENCOUNTER — Emergency Department (HOSPITAL_COMMUNITY)
Admission: EM | Admit: 2024-02-07 | Discharge: 2024-02-07 | Disposition: A | Discharge status: Home / Self Care | Payer: Worker's Compensation | Attending: Emergency Medicine | Admitting: Emergency Medicine

## 2024-02-07 ENCOUNTER — Emergency Department (HOSPITAL_COMMUNITY): Payer: Worker's Compensation

## 2024-02-07 ENCOUNTER — Encounter (HOSPITAL_COMMUNITY): Payer: Self-pay

## 2024-02-07 ENCOUNTER — Other Ambulatory Visit: Payer: Self-pay

## 2024-02-07 DIAGNOSIS — S60111A Contusion of right thumb with damage to nail, initial encounter: Secondary | ICD-10-CM | POA: Insufficient documentation

## 2024-02-07 DIAGNOSIS — S6991XA Unspecified injury of right wrist, hand and finger(s), initial encounter: Secondary | ICD-10-CM | POA: Diagnosis present

## 2024-02-07 DIAGNOSIS — W230XXA Caught, crushed, jammed, or pinched between moving objects, initial encounter: Secondary | ICD-10-CM | POA: Diagnosis not present

## 2024-02-07 DIAGNOSIS — S6010XA Contusion of unspecified finger with damage to nail, initial encounter: Secondary | ICD-10-CM

## 2024-02-07 DIAGNOSIS — Y990 Civilian activity done for income or pay: Secondary | ICD-10-CM | POA: Diagnosis not present

## 2024-02-07 MED ORDER — ACETAMINOPHEN 500 MG PO TABS
1000.0000 mg | ORAL_TABLET | Freq: Once | ORAL | Status: AC
  Administered 2024-02-07: 1000 mg via ORAL
  Filled 2024-02-07: qty 2

## 2024-02-07 MED ORDER — LIDOCAINE HCL (PF) 1 % IJ SOLN
5.0000 mL | Freq: Once | INTRAMUSCULAR | Status: AC
  Administered 2024-02-07: 5 mL
  Filled 2024-02-07: qty 30

## 2024-02-07 MED ORDER — OXYCODONE HCL 5 MG PO TABS
5.0000 mg | ORAL_TABLET | Freq: Once | ORAL | Status: AC
  Administered 2024-02-07: 5 mg via ORAL
  Filled 2024-02-07: qty 1

## 2024-02-07 NOTE — ED Triage Notes (Signed)
 Pt reports being at work and slamming right thumb in door. Pt took ibuprofen  one hour ago with some relief.

## 2024-02-07 NOTE — Discharge Instructions (Addendum)
 You were evaluated in the emergency room for right thumb injury.  A trephination was performed to evacuate the subungual hematoma.  Please follow with your primary care doctor to ensure your symptoms are improving.

## 2024-02-07 NOTE — ED Provider Notes (Signed)
 [place] EMERGENCY DEPARTMENT AT [facility] Provider Note   CSN: [id number] Arrival date & time: 02/07/24  1836     History  Chief Complaint  Patient presents with   Finger Injury    Janice Chase is a 43 y.o. female with noncontributory past medical history presents with injury to her right thumb.  Patient states she slammed her right thumb in a door.  She took an ibuprofen  an hour ago with some relief.  No numbness or tingling.  HPI       Home Medications Prior to Admission medications   Medication Sig Start Date End Date Taking? Authorizing Provider  acetaminophen  (TYLENOL ) 500 MG tablet Take 500 mg by mouth every 6 (six) hours as needed.    [provider]  cyclobenzaprine  (FEXMID ) 7.5 MG tablet Take 7.5 mg by mouth daily as needed.    [provider]  fluticasone (FLONASE) 50 MCG/ACT nasal spray Place into the nose. 11/13/21   [provider]  ibuprofen  (ADVIL ) 600 MG tablet Take by mouth.    [provider]  IUD'S IU by Intrauterine route.    [provider]  levocetirizine (XYZAL) 5 MG tablet Take 1 tablet by mouth daily. 08/12/21   [provider]      Allergies    Patient has no known allergies.    Review of Systems   Review of Systems  Musculoskeletal:  Positive for myalgias.    Physical Exam Updated Vital Signs BP 116/71 (BP Location: Right Arm)   Pulse 73   Temp 98.3 F (36.8 C) (Oral)   Resp 18   Ht 4\' 11"  (1.499 m)   Wt 87.1 kg   SpO2 100%   BMI 38.78 kg/m  Physical Exam Constitutional:      Appearance: Normal appearance.  HENT:     Head: Normocephalic and atraumatic.  Pulmonary:     Effort: Pulmonary effort is normal. No respiratory distress.  Musculoskeletal:     Comments: Patient with subungual hematoma and associated tenderness to the distal phalanx, tolerates full range of motion of his thumb, capable of making a full fist, radial pulses 2+  Neurological:     Mental  Status: She is alert.     ED Results / Procedures / Treatments   Labs (all labs ordered are listed, but only abnormal results are displayed) Labs Reviewed - No data to display  EKG None  Radiology DG Finger Thumb Right Result Date: 02/07/2024 CLINICAL DATA:  Thumb injury the the the EXAM: RIGHT THUMB 2+V COMPARISON:  None Available. FINDINGS: There is no evidence of fracture or dislocation. There is no evidence of arthropathy or other focal bone abnormality. Soft tissues are unremarkable. IMPRESSION: Negative. Electronically Signed   By: Tyron Gallon M.D.   On: 02/07/2024 20:09    Procedures .Nerve Block  Date/Time: 02/07/2024 9:42 PM  Performed by: Felicie Horning, PA-C Authorized by: Felicie Horning, PA-C   Consent:    Consent obtained:  Verbal   Consent given by:  Patient   Risks discussed:  Pain   Alternatives discussed:  No treatment Universal protocol:    Procedure explained and questions answered to patient or proxy's satisfaction: yes     Patient identity confirmed:  Verbally with patient Indications:    Indications:  Pain relief Location:    Body area:  Upper extremity   Laterality:  Right Skin anesthesia:    Skin anesthesia method:  Local infiltration   Local anesthetic:  Lidocaine  1% w/o epi Comments:     Right thumb digital nerve block performed with trephination     Medications Ordered in ED Medications  acetaminophen  (TYLENOL ) tablet 1,000 mg (has no administration in time range)  lidocaine  (PF) (XYLOCAINE ) 1 % injection 5 mL (has no administration in time range)  oxyCODONE  (Oxy IR/ROXICODONE ) immediate release tablet 5 mg (has no administration in time range)    ED Course/ Medical Decision Making/ A&P                                 Medical Decision Making Amount and/or Complexity of Data Reviewed Radiology: ordered.  Risk OTC drugs. Prescription drug management.   This patient presents to the ED with chief complaint(s) of finger  injury.  The complaint involves an extensive differential diagnosis and also carries with it a high risk of complications and morbidity.   Pertinent past medical history as listed in HPI  The differential diagnosis includes  Fracture, dislocation, sprain Additional history obtained: Additional history obtained from family Records reviewed Care Everywhere/External Records  Assessment and management:   Patient presents with thumb injury.  She slammed her thumb in the door.  No numbness or tingling.  On exam she has a subungual hematoma with tenderness to the distal phalanx, there is no wound she is capable making a full fist there is no significant swelling, radial pulses are 2+, NVI, cap refill less than 2 secs.  No suspicion for vascular, tendon or nerve injury.  X-rays without any evidence of fracture.  Trephination performed for subungual hematoma with successful evacuation.  Patient placed in a splint for comfort and discharged home with PCP follow-up.  Independent ECG interpretation:  none  Independent labs interpretation:  The following labs were independently interpreted:  none  Independent visualization and interpretation of imaging: I independently visualized the following imaging with scope of interpretation limited to determining acute life threatening conditions related to emergency care: Thumb x-ray negative   Consultations obtained:   none  Disposition:   Patient will be discharged home. The patient has been appropriately medically screened and/or stabilized in the ED. I have low suspicion for any other emergent medical condition which would require further screening, evaluation or treatment in the ED or require inpatient management. At time of discharge the patient is hemodynamically stable and in no acute distress. I have discussed work-up results and diagnosis with patient and answered all questions. Patient is agreeable with discharge plan. We discussed strict return  precautions for returning to the emergency department and they verbalized understanding.     Social Determinants of Health:   none  This note was dictated with voice recognition software.  Despite best efforts at proofreading, errors may have occurred which can change the documentation meaning.          Final Clinical Impression(s) / ED Diagnoses Final diagnoses:  Injury of finger of right hand, initial encounter  Subungual hematoma of digit of hand, initial encounter    Rx / DC Orders ED Discharge Orders     None         Stanton Earthly 02/07/24 2144    Dalene Duck, MD 02/07/24 2322

## 2024-09-21 ENCOUNTER — Other Ambulatory Visit: Payer: Self-pay | Admitting: Family

## 2024-09-21 DIAGNOSIS — R1012 Left upper quadrant pain: Secondary | ICD-10-CM
